# Patient Record
Sex: Female | Born: 1935 | Race: White | Hispanic: No | Marital: Married | Smoking: Never smoker
Health system: Southern US, Community
[De-identification: ages and names within clinical notes are randomized; demographics above are authoritative.]

## PROBLEM LIST (undated history)

## (undated) DIAGNOSIS — H9110 Presbycusis, unspecified ear: Secondary | ICD-10-CM

## (undated) DIAGNOSIS — I10 Essential (primary) hypertension: Secondary | ICD-10-CM

## (undated) DIAGNOSIS — F0390 Unspecified dementia without behavioral disturbance: Secondary | ICD-10-CM

## (undated) DIAGNOSIS — R112 Nausea with vomiting, unspecified: Secondary | ICD-10-CM

## (undated) DIAGNOSIS — I739 Peripheral vascular disease, unspecified: Secondary | ICD-10-CM

## (undated) DIAGNOSIS — M810 Age-related osteoporosis without current pathological fracture: Secondary | ICD-10-CM

## (undated) DIAGNOSIS — K589 Irritable bowel syndrome without diarrhea: Secondary | ICD-10-CM

## (undated) DIAGNOSIS — I509 Heart failure, unspecified: Secondary | ICD-10-CM

## (undated) DIAGNOSIS — Z9889 Other specified postprocedural states: Secondary | ICD-10-CM

## (undated) DIAGNOSIS — H409 Unspecified glaucoma: Secondary | ICD-10-CM

## (undated) DIAGNOSIS — K5730 Diverticulosis of large intestine without perforation or abscess without bleeding: Secondary | ICD-10-CM

## (undated) DIAGNOSIS — I998 Other disorder of circulatory system: Secondary | ICD-10-CM

## (undated) DIAGNOSIS — N183 Chronic kidney disease, stage 3 (moderate): Secondary | ICD-10-CM

## (undated) HISTORY — DX: Irritable bowel syndrome, unspecified: K58.9

## (undated) HISTORY — DX: Presbycusis, unspecified ear: H91.10

## (undated) HISTORY — DX: Diverticulosis of large intestine without perforation or abscess without bleeding: K57.30

## (undated) HISTORY — DX: Heart failure, unspecified: I50.9

## (undated) HISTORY — DX: Age-related osteoporosis without current pathological fracture: M81.0

## (undated) HISTORY — PX: ABDOMINAL HYSTERECTOMY: SHX81

---

## 1966-10-06 HISTORY — PX: BREAST SURGERY: SHX581

## 1998-12-04 ENCOUNTER — Ambulatory Visit (HOSPITAL_COMMUNITY): Admission: RE | Admit: 1998-12-04 | Discharge: 1998-12-04 | Payer: Self-pay | Admitting: Gastroenterology

## 2002-09-06 ENCOUNTER — Emergency Department (HOSPITAL_COMMUNITY): Admission: EM | Admit: 2002-09-06 | Discharge: 2002-09-06 | Payer: Self-pay | Admitting: Emergency Medicine

## 2002-09-06 ENCOUNTER — Encounter: Payer: Self-pay | Admitting: Emergency Medicine

## 2007-12-05 ENCOUNTER — Ambulatory Visit: Payer: Self-pay | Admitting: Surgery

## 2007-12-05 ENCOUNTER — Inpatient Hospital Stay (HOSPITAL_COMMUNITY): Admission: EM | Admit: 2007-12-05 | Discharge: 2007-12-09 | Payer: Self-pay | Admitting: Emergency Medicine

## 2007-12-05 ENCOUNTER — Encounter (INDEPENDENT_AMBULATORY_CARE_PROVIDER_SITE_OTHER): Payer: Self-pay | Admitting: Emergency Medicine

## 2010-07-11 ENCOUNTER — Encounter: Admission: RE | Admit: 2010-07-11 | Discharge: 2010-07-11 | Payer: Self-pay | Admitting: Family Medicine

## 2010-09-13 ENCOUNTER — Emergency Department (HOSPITAL_BASED_OUTPATIENT_CLINIC_OR_DEPARTMENT_OTHER)
Admission: EM | Admit: 2010-09-13 | Discharge: 2010-09-14 | Payer: Self-pay | Source: Home / Self Care | Admitting: Emergency Medicine

## 2010-09-17 ENCOUNTER — Encounter
Admission: RE | Admit: 2010-09-17 | Discharge: 2010-09-17 | Payer: Self-pay | Source: Home / Self Care | Attending: Orthopedic Surgery | Admitting: Orthopedic Surgery

## 2010-10-16 ENCOUNTER — Encounter
Admission: RE | Admit: 2010-10-16 | Discharge: 2010-10-16 | Payer: Self-pay | Source: Home / Self Care | Attending: Orthopedic Surgery | Admitting: Orthopedic Surgery

## 2010-10-18 ENCOUNTER — Encounter
Admission: RE | Admit: 2010-10-18 | Discharge: 2010-10-18 | Payer: Self-pay | Source: Home / Self Care | Attending: Orthopedic Surgery | Admitting: Orthopedic Surgery

## 2010-10-27 ENCOUNTER — Encounter: Payer: Self-pay | Admitting: Family Medicine

## 2010-12-05 ENCOUNTER — Ambulatory Visit: Payer: Medicare Other | Attending: Orthopedic Surgery | Admitting: Physical Therapy

## 2010-12-05 DIAGNOSIS — M25619 Stiffness of unspecified shoulder, not elsewhere classified: Secondary | ICD-10-CM | POA: Insufficient documentation

## 2010-12-05 DIAGNOSIS — IMO0001 Reserved for inherently not codable concepts without codable children: Secondary | ICD-10-CM | POA: Insufficient documentation

## 2010-12-05 DIAGNOSIS — M25519 Pain in unspecified shoulder: Secondary | ICD-10-CM | POA: Insufficient documentation

## 2010-12-05 DIAGNOSIS — R293 Abnormal posture: Secondary | ICD-10-CM | POA: Insufficient documentation

## 2010-12-11 ENCOUNTER — Ambulatory Visit: Payer: Medicare Other | Admitting: Physical Therapy

## 2010-12-13 ENCOUNTER — Ambulatory Visit: Payer: Medicare Other | Admitting: Physical Therapy

## 2010-12-16 ENCOUNTER — Ambulatory Visit: Payer: Medicare Other | Admitting: Physical Therapy

## 2010-12-19 ENCOUNTER — Ambulatory Visit: Payer: Medicare Other | Admitting: Physical Therapy

## 2010-12-24 ENCOUNTER — Ambulatory Visit: Payer: Medicare Other | Admitting: Physical Therapy

## 2010-12-26 ENCOUNTER — Ambulatory Visit: Payer: Medicare Other | Admitting: Physical Therapy

## 2010-12-31 ENCOUNTER — Ambulatory Visit: Payer: Medicare Other | Admitting: Physical Therapy

## 2011-01-01 ENCOUNTER — Ambulatory Visit: Payer: Medicare Other | Admitting: Physical Therapy

## 2011-01-02 ENCOUNTER — Ambulatory Visit: Payer: Medicare Other | Admitting: Physical Therapy

## 2011-01-06 ENCOUNTER — Ambulatory Visit: Payer: Medicare Other | Attending: Orthopedic Surgery | Admitting: Physical Therapy

## 2011-01-06 DIAGNOSIS — M25519 Pain in unspecified shoulder: Secondary | ICD-10-CM | POA: Insufficient documentation

## 2011-01-06 DIAGNOSIS — M25619 Stiffness of unspecified shoulder, not elsewhere classified: Secondary | ICD-10-CM | POA: Insufficient documentation

## 2011-01-06 DIAGNOSIS — R293 Abnormal posture: Secondary | ICD-10-CM | POA: Insufficient documentation

## 2011-01-06 DIAGNOSIS — IMO0001 Reserved for inherently not codable concepts without codable children: Secondary | ICD-10-CM | POA: Insufficient documentation

## 2011-01-08 ENCOUNTER — Ambulatory Visit: Payer: Medicare Other | Admitting: Physical Therapy

## 2011-01-13 ENCOUNTER — Ambulatory Visit: Payer: Medicare Other | Admitting: Physical Therapy

## 2011-01-15 ENCOUNTER — Ambulatory Visit: Payer: Medicare Other | Admitting: Physical Therapy

## 2011-02-18 NOTE — Op Note (Signed)
[name], [name]             ACCOUNT NO.:  [id number]   MEDICAL RECORD NO.:  [id number]          PATIENT TYPE:  INP   LOCATION:  1310                         FACILITY:  [facility]   PHYSICIAN:  Eulas Post, MD    DATE OF BIRTH:  Aug 26, 1923   DATE OF PROCEDURE:  12/06/2007  DATE OF DISCHARGE:                               OPERATIVE REPORT   PREOPERATIVE DIAGNOSIS:  Left knee pain.   POSTOPERATIVE DIAGNOSIS:  Left knee pain.   PROCEDURE:  Left knee aspiration and injection.   ASPIRATE:  10 mL of clear normal appearing joint fluid.   INJECTED:  40 mg of Kenalog in 10 mL of Marcaine.   INDICATIONS FOR PROCEDURE:  The patient is an 77 year old woman who has  acute left knee pain that is so severe it has required hospitalization.  She elected to undergo the above named procedure.  The risks, benefits,  and alternatives were discussed.  She is willing to proceed.   DESCRIPTION OF PROCEDURE:  The left knee was prepped using Betadine.  An  18 gauge needle was used to aspirate and inject.  The patient tolerated  the procedure well and there were no complications.      Eulas Post, MD  Electronically Signed     JPL/MEDQ  D:  12/06/2007  T:  12/06/2007  Job:  [id number]

## 2011-02-18 NOTE — Consult Note (Signed)
[name], [name]             ACCOUNT NO.:  [id number]   MEDICAL RECORD NO.:  [id number]          PATIENT TYPE:  INP   LOCATION:  1310                         FACILITY:  [facility]   PHYSICIAN:  Eulas Post, MD    DATE OF BIRTH:  05-Aug-1923   DATE OF CONSULTATION:  12/06/2007  DATE OF DISCHARGE:                                 CONSULTATION   REQUESTING PHYSICIAN:  Michaelyn Barter, M.D.   CHIEF COMPLAINT:  Left knee pain.   HISTORY:  [name] is an 77 year old woman who complains of  sharp 9/10 left sided medial knee pain for the past 2 weeks.  She says  resting it makes it better and putting weight on it or trying to move  the knee makes it worse.  She has been unable to walk.  She was unable  to be managed at home and was brought into the emergency room and  admitted for pain control.  She has been trying to take  antiinflammatories per her medical doctor, but she has not had any  improvement.   REVIEW OF SYSTEMS:  She reports the recent musculoskeletal changes as  above.  She also reports recent skin problems with multiple skin  cancer  on her face that she is being treated for.  She denies any recent  weight, visual changes, hearing changes, chest pain, shortness of  breath, bowel or bladder problems, neurologic problems, psychiatric  problems, easy bruising, or immune problems.   PAST MEDICAL HISTORY:  Significant for hypertension and shoulder  bursitis over 20 years ago.   FAMILY HISTORY:  She denies history of gout.  Her father has a history  of coronary artery disease.   SOCIAL HISTORY:  She denies significant alcohol use.   PHYSICAL EXAMINATION:  VITAL SIGNS:  She has a temperature of 98.7,  pulse 72, blood pressure 147/54.  GENERAL:  She is in no acute distress.  NECK:  Her trachea is midline and she has symmetric musculature with  full range of motion of her neck without any radiculopathy.  HEENT:  Demonstrates healing scars over her face from the  work done on  her skin cancers.  CARDIAC:  She has a regular rate and rhythm and she had trace edema  distally in her legs.  RESPIRATORY:  She has no increase in respiratory efforts.  ABDOMEN:  Soft, nontender, nondistended with no masses.  LYMPHATIC:  She has no lymphadenopathy in her neck or axilla.  PSYCHIATRIC:  Her mood and affect are normal and her judgment and  insight are intact.  NEUROLOGIC:  Her sensation is intact distally and she has 2 plus  patellar tendon reflexes in both knees.  MUSCULOSKELETAL:  She is unable to walk due to pain.  Her digits and  nails are normal.  Her right lower extremity has full range of motion at  the knee with 5/5 strength, no instability and no tenderness.  The left  knee has significant medial joint line tenderness.  It has a mild  effusion.  The knee feels stable for varus and valgus stress, although  she gets pain  with valgus stress.  She is able to do a straight leg  raise but has pain.  Her sensation and motor are intact at her foot.  She does not have any pain with hip motion.  She has a negative straight  leg raise.   She has x-rays that demonstrate evidence for early degenerative changes,  however, these x-rays are non-weightbearing and difficult to interpret.  She has an MRI which the radiologist report at least on the preliminary  indicated no evidence for meniscal tear.  However, I feel confident that  there actually is a meniscus tear as seen on the T2 weighted images as a  high signal intensity extending through the meniscus and exiting out the  inferior surface.   IMPRESSION:  Left knee pain, consistent with a meniscus tear.   PLAN:  I have recommended a knee injection.  We are going to try and  aspirate joint fluid and send it for cell count as well as culture and  sensitivity and crystals.  She does not have any erythema over the knee  and I do not suspect any type of infection, but gout is certainly a  possibility.  We are  going to plan to aspirate her knee and see how she  does.  I will also inject Kenalog and some Marcaine.  If she is able to  do well with this, then we will continue to treat her conservatively.  It she does poorly then she may need an arthroscopy.      Eulas Post, MD  Electronically Signed     JPL/MEDQ  D:  12/06/2007  T:  12/06/2007  Job:  [id number]

## 2011-02-18 NOTE — H&P (Signed)
[name], [name]             ACCOUNT NO.:  [id number]   MEDICAL RECORD NO.:  [id number]          PATIENT TYPE:  INP   LOCATION:  0105                         FACILITY:  [facility]   PHYSICIAN:  Michaelyn Barter, M.D. DATE OF BIRTH:  04/10/1923   DATE OF ADMISSION:  12/05/2007  DATE OF DISCHARGE:                              HISTORY & PHYSICAL   PRIMARY CARE PHYSICIAN:  Gloriajean Dell. Andrey Campanile, M.D.   CHIEF COMPLAINT:  Left knee pain.   HISTORY OF PRESENT ILLNESS:  Ms. Brunet is an 77 year old female with  past medical history of hypertension who indicates that she developed  left knee pain approximately 2 weeks ago. She states that the pain has  progressively become worse since its initial onset. It has progressed to  the point whereby she can no longer walk secondary to pain in her left  knee.  She indicates that she went to see her primary care doctor, Dr.  Arletha Grippe, on Wednesday of last week which is 5 days ago.  X-rays were  done, and the patient was told that there may have been a bone fragment  present; arthritis could have also been present and possibly bursitis.  Ibuprofen was prescribed; however, the patient's symptoms became worse.  She indicates that the day after seeing her primary care doctor, her  knee began to swell, and, as a result, she has experiencing even more  decreased range of motion. She now indicates that she is unable to walk  without the assistance of at least 1 or 2 other individuals.  There have  been no fevers or chills.  No trauma.  She has never had similar pain  before.   PAST MEDICAL HISTORY:  1. Bursitis in both shoulders  2. Hypertension.   PAST SURGICAL HISTORY:  1. Hysterectomy.  2. Benign tumor removed from both breasts.   ALLERGIES:  1. PENICILLIN.  2. SULFA,  3. DEMEROL.  4. CODEINE.  5. PHENOBARBITAL.  6. DARVON.   SOCIAL HISTORY:  Cigarettes:  Never.  Alcohol:  Occasional.   FAMILY HISTORY:  The patient does not provide.   REVIEW  OF SYSTEMS:  As per HPI, otherwise all other systems are  negative.   PHYSICAL EXAMINATION:  GENERAL:  The patient is awake.  She is  cooperative.  She is in no obvious in no obvious distress.  VITAL SIGNS:  Temperature is 98.1, blood pressure 145/60, heart rate 54,  respirations 18 O2 saturation 96% on room air.  HEENT:  Atraumatic, normocephalic.  Anicteric. Extraocular muscles are  intact.  Oral mucosa is pink.  No thrush or exudates.  NECK:  Supple.  No lymphadenopathy, no thyromegaly.  CARDIAC:  S1-S2 present.  Regular rate and rhythm.  RESPIRATORY:  No crackles or wheezes.  ABDOMEN:  Flat, soft, nontender, nondistended.  Positive bowel sounds x4  quadrants.  No masses palpated.  EXTREMITIES:  Left knee is slightly swollen.  There is no erythema  visible.  There is some slight discomfort along the medial aspect of the  patient's knee to deep palpation.  Range of motion is significantly  decreased secondary to pain whenever  the patient tries to move her leg  against gravity.  There is no warmth noted to palpation.  NEUROLOGIC:  The patient is alert and oriented to person.  She states  that the month is February; the year is 2009.  She knows that she is in  the hospital but does not know which hospital.  MUSCULOSKELETAL:  5/5 bilateral arm strength.  Right leg strength is  5/5. Left leg strength is significantly decreased secondary to pain.   X-rays of the patient's knee were negative.   The patient's sodium level is 141, potassium 4.3, chloride 105, CO2 27,  glucose 92, BUN 33, creatinine 1.53, bilirubin total 0.9, alkaline  phosphatase 67, SGOT 20, SGPT 14, total protein 7.2, albumin 3.4,  calcium 9.4. PTT 31, PT 13.6, INR 1.0. D-dimer 0.83.  White blood cell  count 12.3, hemoglobin 11.9, hematocrit 34.7, platelets 256.   ASSESSMENT/PLAN:  1. Acute left knee pain.  The etiology of this is questionable.  X-      rays completed do not reveal the source of pain. The  differential      includes sources of monoarticular joint disease such as gout,      osteoarthritis, and septic joint.  It appears that the patient has      failed out patient therapy.  Her symptoms appear to have become      worse.  Will order an MRI of the patient's knee.  Will consult      orthopedic surgery. Will provide p.r.n. pain medications.  2. Renal insufficiency.  The acuity of this is questionable.  This may      be prerenal.  We will gently hydrate the patient and monitor BUN      and creatinine.  3. Hypoalbuminemia.  The significance of this is questionable.  Will      continue to monitor.  4. Mildly increased D-dimer.  The significance of this is      questionable.  The patient currently does not have any chest pain      or shortness of breath.  Will monitor this.  5. Gastrointestinal prophylaxis. Will provide Protonix.  6. Deep vein thrombosis prophylaxis.  Will provide Lovenox.  7. Bradycardia.  The patient is otherwise asymptomatic.  Will monitor.      Michaelyn Barter, M.D.  Electronically Signed     OR/MEDQ  D:  12/05/2007  T:  12/05/2007  Job:  [id number]   cc:   Gloriajean Dell. Andrey Campanile, M.D.  Fax: [phone]

## 2011-02-18 NOTE — Discharge Summary (Signed)
[name], [name]             ACCOUNT NO.:  [id number]   MEDICAL RECORD NO.:  [id number]          PATIENT TYPE:  INP   LOCATION:  1310                         FACILITY:  [facility]   PHYSICIAN:  Mobolaji B. Bakare, M.D.DATE OF BIRTH:  [birth year]   DATE OF ADMISSION:  12/05/2007  DATE OF DISCHARGE:  12/09/2007                               DISCHARGE SUMMARY   PRIMARY CARE PHYSICIAN:  Dr. Benedetto Goad   FINAL DIAGNOSES:  1. Left knee pain and effusion secondary to meniscal tear with      bursitis, pes anseris, bursitis also noted on MRI.  2. Renal insufficiency, improved   SECONDARY DIAGNOSIS:  Hypertension.   PROCEDURES:  1. X-ray, left knee, shows no acute findings.  There is evidence of      degenerative changes.  2. MRI left knee shows no active bony findings and very little      degenerative change for patient's age, meniscal degenerative change      but no discrete meniscal tear, intact ligamentous structures.      There is edema and fluid around the pes anserine tendon suggesting      pes anserinus bursitis.  There is also mild associated MCA      bursitis, small to moderate-sized joint effusion, medial patellar      plica noted. Small partially ruptured Baker's cyst.  3. Joint aspiration by Dr. Dion Saucier (orthopedic surgeon).   BRIEF HISTORY:  Please refer to the admission H&P for full details.  In  brief, [name] is an 77 year old Caucasian female who started  experiencing left knee pain about two weeks prior to presentation.  He  gradually progressed such that she had limitation with ambulation.  She  saw a primary care physician.  She had an x-ray done as an outpatient.  There was no acute fracture.  Ibuprofen was prescribed, and symptoms  were not getting any better.  Hence, the patient came over to the  emergency room for pain control.   HOSPITAL COURSE:  The patient was seen in consultation by orthopedic  surgeon.  She had an MRI; result is as noted above.  Dr.  Dion Saucier saw the  patient and opined that these findings are consistent with acute  meniscal tear.  However, he aspirated the left joint to rule out acute  gout.  Fluid was sent for analysis, and results indicated no gout.  There were no crystals.  White cell count was 5000.  Fluid culture has  shown no growth so far.  The patient had no leukocytosis or elevated  white cell count.  Hence, she was not started on any antibiotics.  She  improved subsequent to the joint aspiration.  She was seen by physical  and occupational therapy.  The patient has progressed slowly.  It is  felt that she will need short-term skilled nursing facility placement  for rehabilitation.  This has been arranged.   Acute renal insufficiency was felt to be secondary to dehydration and  probably nonsteroidal anti-inflammatory.  BUN was 33 and creatinine 1.53  on admission.  At the time of discharge, BUN was 37 and  creatinine of  1.16.  It should be noted that the patient's BUN actually normalized  with IV fluids.  However, given the degree of pain, orthopedic surgeon  recommended scheduled ibuprofen, and  BUN has been increasing.  This  nonsteroidal will be discontinued at the time of discharge, and the  patient will continue with oxycodone p.r.n. in the nursing facility.  The patient's blood pressure medication including Diovan  hydrochlorothiazide were initially held due to the elevated BUN and  creatinine, but these are normalizing.  Blood pressure is increasing.  Hence, Diovan hydrochlorothiazide will be resumed, along with Cartia XT.  She will need BMET checked in 1 week.   The patient's hemoglobin was relatively normal at the time of admission,  11.9, and it trended downwards 10.2 at the time of discharge.  This was  felt to be related to hemodilution.  She should have a CBC checked in 2-  3 weeks, and if necessary, anemia workup can be initiated by Dr. Andrey Campanile.   Sinus bradycardia.  The patient was noted on  admission to have sinus  bradycardia with a heart rate of 54.  She has been on atenolol prior to  hospitalization.  Atenolol was held on admission.  Bradycardia has  improved.  The patient was asymptomatic.  Atenolol will be restarted at  a lower dose, and he can be gradually titrated for blood pressure as her  heart rate permits.   DISCHARGE MEDICATION:  1. Oxycodone 5 mg p.o. q.4-6h. p.r.n. x4 for moderate to severe pain.  2. Tylenol 650 mg p.o. q.6h. p.r.n. for mild pain.  3. Multivitamin.  4. Aspirin 81 mg daily.  5. Atenolol 25 mg daily.  6. Diovan hydrochlorothiazide 160/25 mg daily.  7. Cartia XT 240 mg daily.   DISCHARGE LABORATORY DATA:  Sodium 136, potassium 4.2, chloride 108,  bicarb 22, BUN 37, creatinine 1.16, glucose 151, calcium 8.3, white  cells 10, hemoglobin 10.2, hematocrit 29.5, platelets 251.   FOLLOW UP/RECOMMENDATION:  1. This follow up with Dr. Dion Saucier in 3 weeks.  Nursing facility to      call for appointment.  Telephone number [phone].  2. Check CBC in 2 weeks.  3. Check BMET in 1 week.   DISCHARGE CONDITION:  Stable.   VITALS AT TIME OF DISCHARGE:  Temperature 98.4, pulse 60, respiratory  20, blood pressure 151/67, O2 saturations 95% on room air.     Patient to follow up with Dr. Benedetto Goad in 1 week.      Mobolaji B. Corky Downs, M.D.  Electronically Signed    MBB/MEDQ  D:  12/09/2007  T:  12/09/2007  Job:  [id number]   cc:   Gloriajean Dell. Andrey Campanile, M.D.  Fax: [phone]   Dion Saucier, M.D.

## 2011-05-03 IMAGING — CR DG HUMERUS 2V *R*
2 series · 2 of 2 positions shown · non-contrast
Comparison: 09/13/2010

CLINICAL DATA: Fell.  Follow-up fracture.

RIGHT HUMERUS - 2+ VIEW

[view not recorded (1 of 2)]
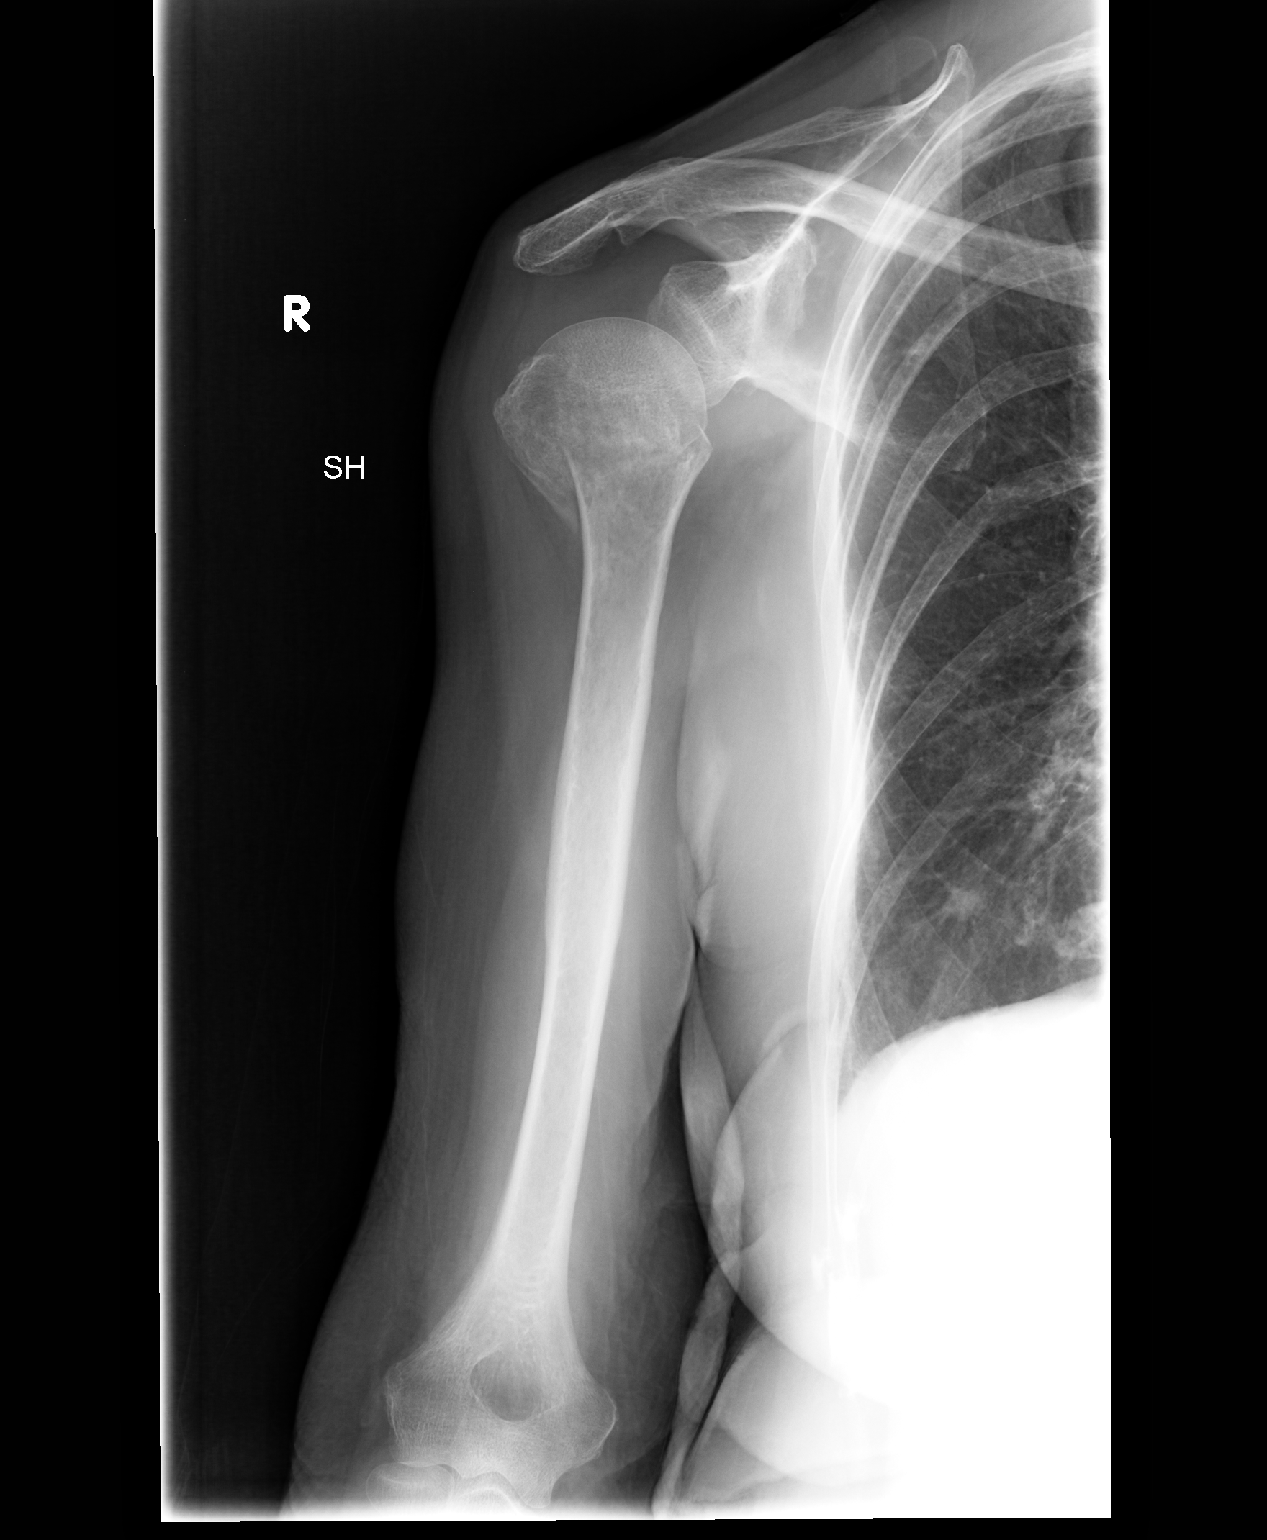

[view not recorded (2 of 2)]
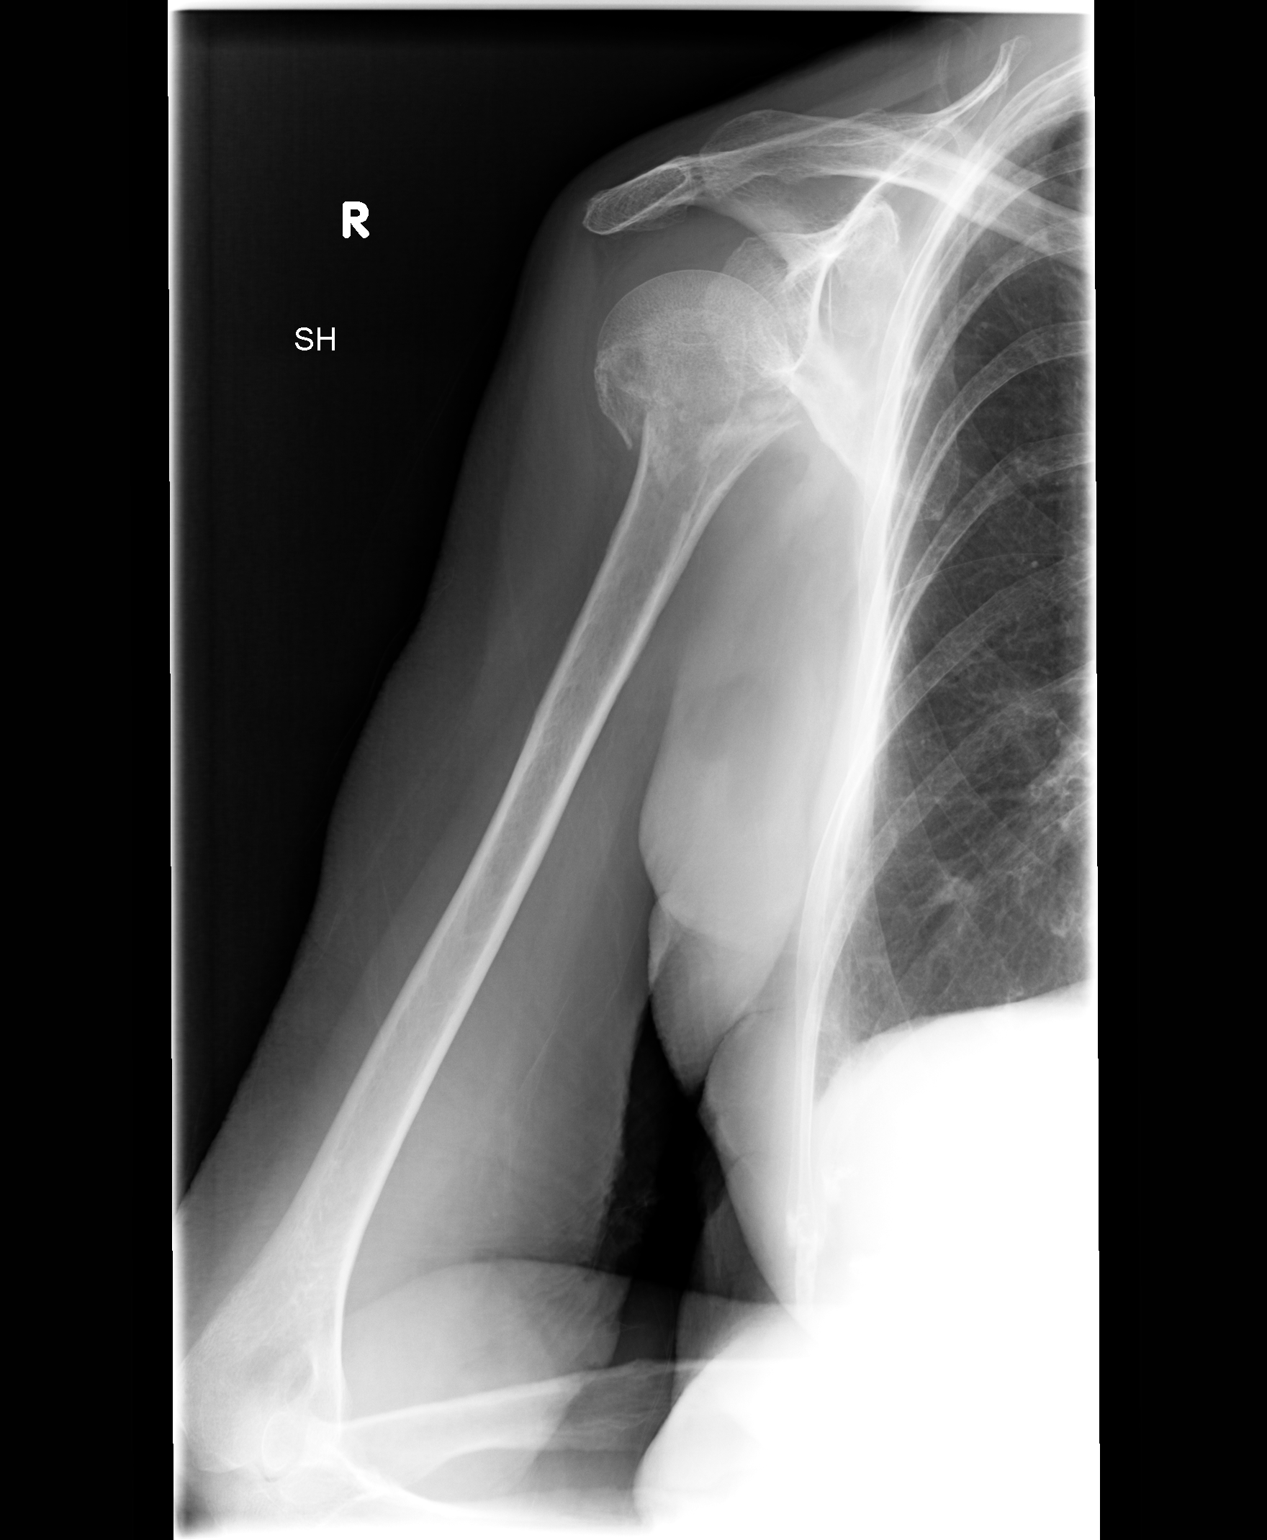

[2 of 2 positions shown; findings below may reference images not displayed]

FINDINGS: No change in position or alignment of the fracture
fragments of the proximal humerus.  Healing is ongoing with callus
formation and indistinct fracture margins.  No new finding
otherwise or any complicating feature.

I do note what appears to be a pulmonary nodule the right lower
lung.  This measures approximately 1 cm in size.  I do not see any
previous imaging of this area.  Two-view chest radiography would be
suggested.
IMPRESSION: No complications seen relating to the proximal humeral fracture.
Evidence of healing with callus formation and indistinctness of the
fracture margins.

Possible 1 cm mass/nodule at the right lung base.  Are there films
at any other location for comparison?  If not, chest radiography
would be suggested.

## 2011-05-05 IMAGING — CR DG CHEST 2V
2 series · 2 of 2 positions shown · non-contrast
Comparison: 10/16/2010 shoulder films.

CLINICAL DATA: Nodule found on shoulder films.  Nonsmoker.  No
chest complaints.

CHEST - 2 VIEW

[view not recorded (1 of 2)]
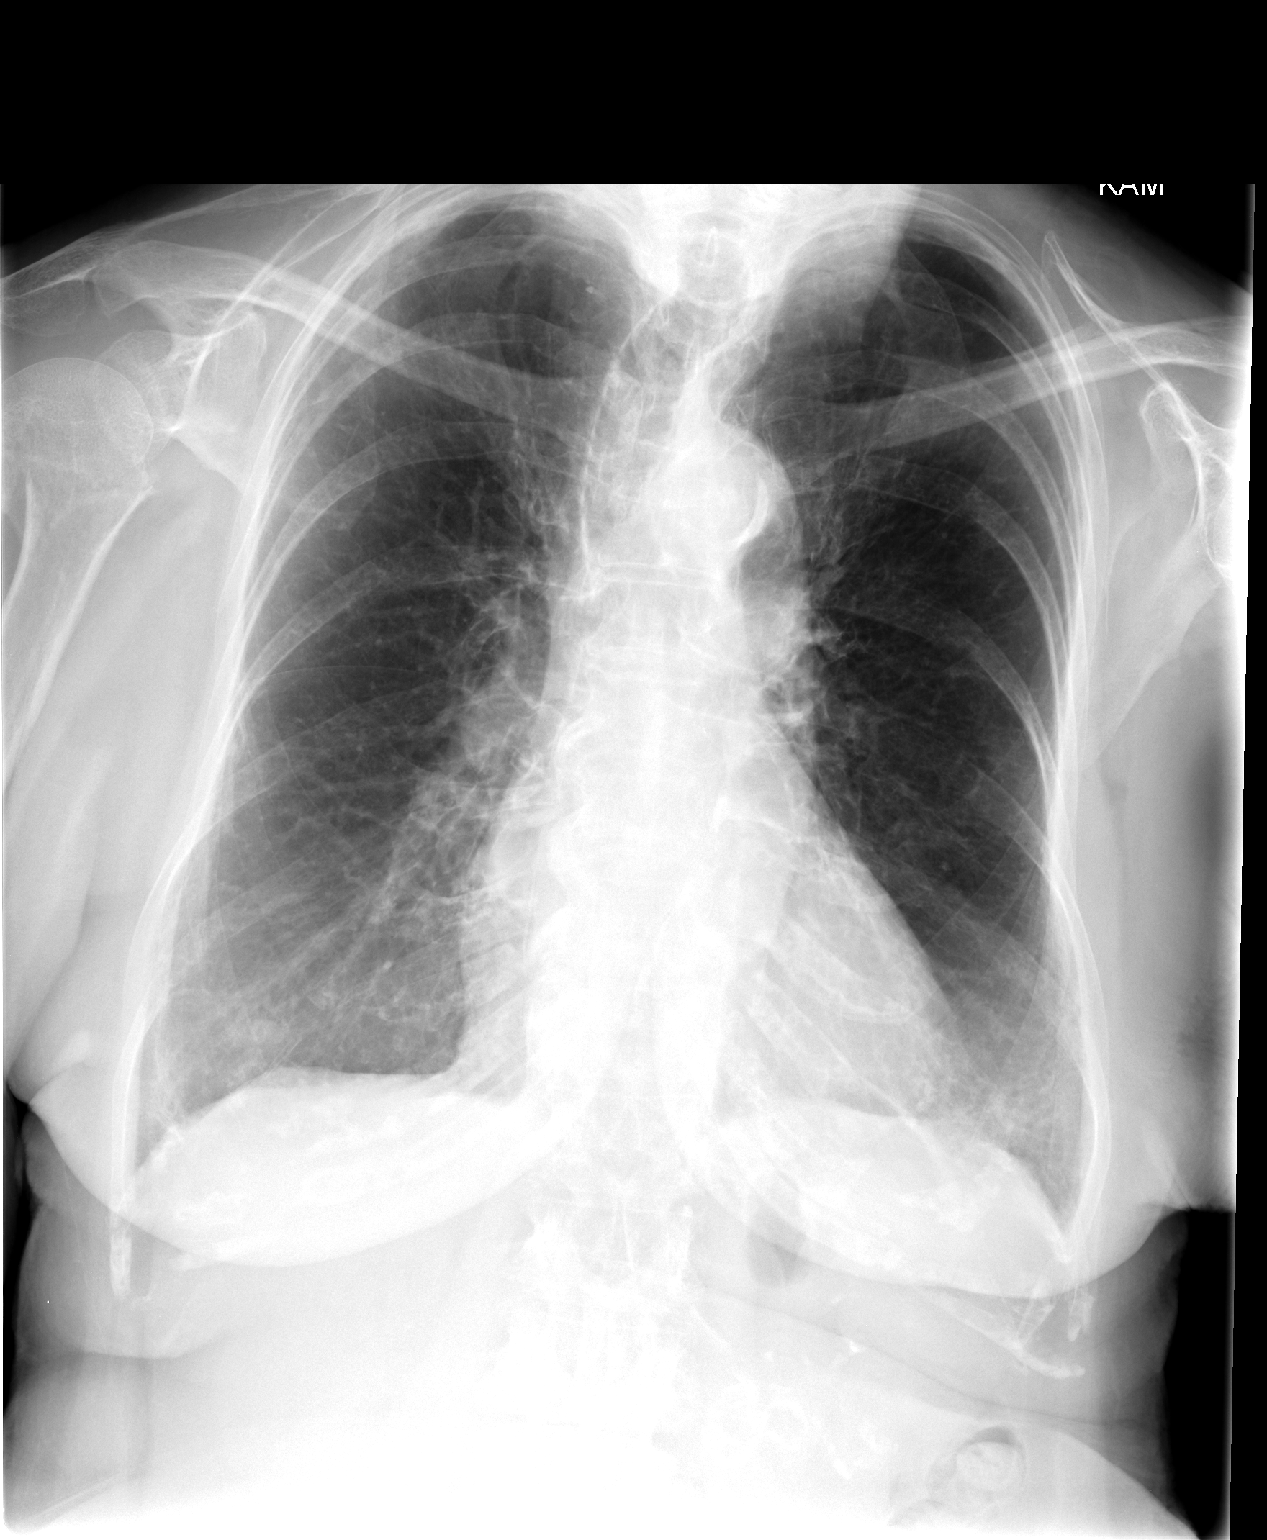

[view not recorded (2 of 2)]
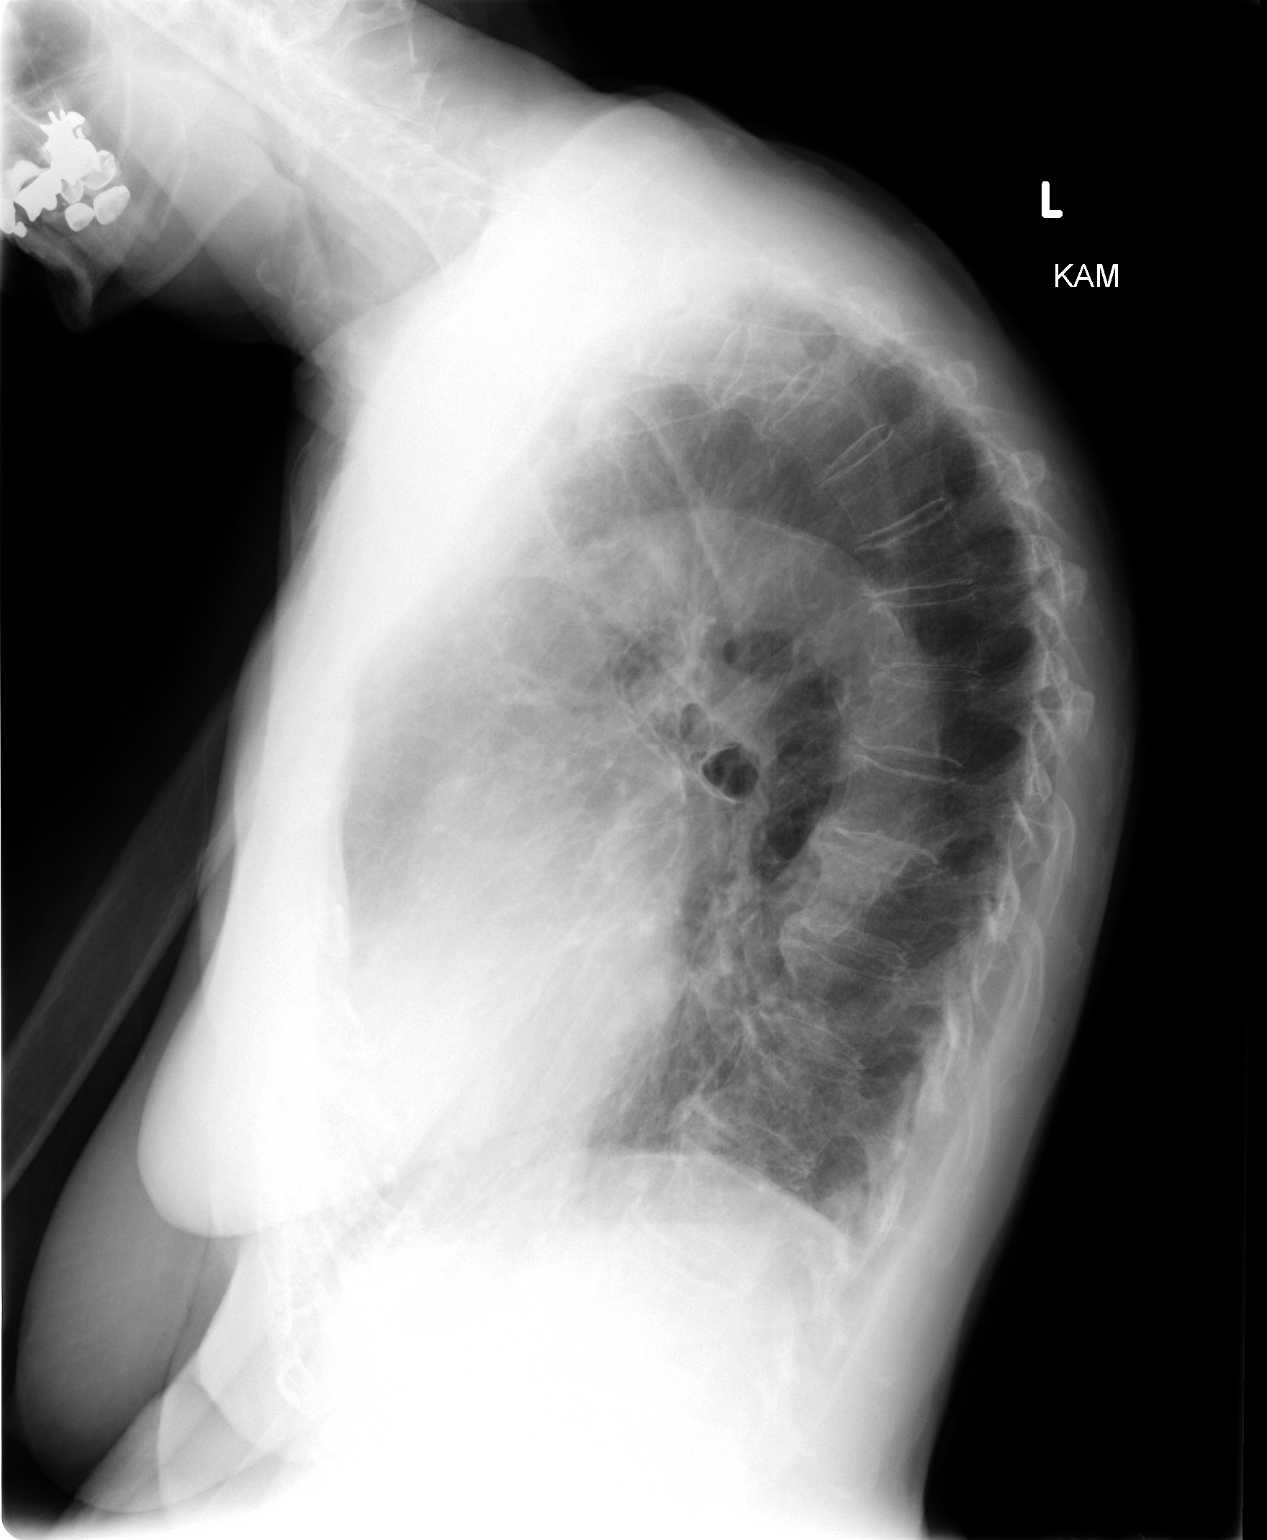

[2 of 2 positions shown; findings below may reference images not displayed]

FINDINGS: Unfortunately, the nodule at the right base remains.  It
is possible this represents a nipple shadow but will require nipple
marker view for further delineation.

Scarring lung bases suspected. No infiltrate, congestive heart
failure or pneumothorax.  Tortuous calcified aorta.  Heart size
within normal limits.  Right humeral neck fracture.

Thoracic kyphosis.
IMPRESSION: Unfortunately, the nodule at the right base remains.  It is
possible this represents a nipple shadow but will require nipple
marker view for further delineation.

## 2011-06-30 LAB — CBC
HCT: 34.7 — ABNORMAL LOW
Hemoglobin: 11.9 — ABNORMAL LOW
MCHC: 34.2
MCHC: 34.8
MCV: 92.1
MCV: 92.5
MCV: 93.2
Platelets: 231
Platelets: 251
RBC: 3.73 — ABNORMAL LOW
WBC: 10
WBC: 12.3 — ABNORMAL HIGH

## 2011-06-30 LAB — BASIC METABOLIC PANEL
BUN: 21
BUN: 25 — ABNORMAL HIGH
BUN: 37 — ABNORMAL HIGH
CO2: 26
Calcium: 8.3 — ABNORMAL LOW
Calcium: 9
Calcium: 9.1
Chloride: 106
Chloride: 108
Creatinine, Ser: 0.97
Creatinine, Ser: 1.16
Creatinine, Ser: 1.27 — ABNORMAL HIGH
GFR calc non Af Amer: 55 — ABNORMAL LOW
Glucose, Bld: 162 — ABNORMAL HIGH

## 2011-06-30 LAB — BODY FLUID CULTURE

## 2011-06-30 LAB — SYNOVIAL CELL COUNT + DIFF, W/ CRYSTALS
Crystals, Fluid: NONE SEEN
Lymphocytes-Synovial Fld: 4
Neutrophil, Synovial: 87 — ABNORMAL HIGH
WBC, Synovial: 5020 — ABNORMAL HIGH

## 2011-06-30 LAB — COMPREHENSIVE METABOLIC PANEL
AST: 20
CO2: 27
Calcium: 9.4
Chloride: 105
Creatinine, Ser: 1.53 — ABNORMAL HIGH
GFR calc Af Amer: 39 — ABNORMAL LOW
GFR calc non Af Amer: 32 — ABNORMAL LOW
Glucose, Bld: 92
Total Bilirubin: 0.9

## 2011-06-30 LAB — DIFFERENTIAL
Basophils Absolute: 0
Eosinophils Absolute: 0.7
Eosinophils Relative: 6 — ABNORMAL HIGH
Lymphocytes Relative: 16
Lymphs Abs: 2
Neutrophils Relative %: 70

## 2011-06-30 LAB — D-DIMER, QUANTITATIVE: D-Dimer, Quant: 0.83 — ABNORMAL HIGH

## 2011-06-30 LAB — APTT: aPTT: 31

## 2011-06-30 LAB — PROTIME-INR: Prothrombin Time: 13.6

## 2013-03-30 ENCOUNTER — Encounter: Payer: Self-pay | Admitting: Cardiology

## 2013-03-30 ENCOUNTER — Ambulatory Visit (INDEPENDENT_AMBULATORY_CARE_PROVIDER_SITE_OTHER): Payer: Medicare Other | Admitting: Cardiology

## 2013-03-30 DIAGNOSIS — Z01818 Encounter for other preprocedural examination: Secondary | ICD-10-CM

## 2013-03-30 DIAGNOSIS — I999 Unspecified disorder of circulatory system: Secondary | ICD-10-CM

## 2013-03-30 DIAGNOSIS — L97509 Non-pressure chronic ulcer of other part of unspecified foot with unspecified severity: Secondary | ICD-10-CM

## 2013-03-30 DIAGNOSIS — Z01810 Encounter for preprocedural cardiovascular examination: Secondary | ICD-10-CM

## 2013-03-30 DIAGNOSIS — I998 Other disorder of circulatory system: Secondary | ICD-10-CM

## 2013-03-30 DIAGNOSIS — I10 Essential (primary) hypertension: Secondary | ICD-10-CM

## 2013-03-30 NOTE — Patient Instructions (Addendum)
Your physician has requested that you have a lower  extremity arterial duplex. This test is an ultrasound of the arteries in the legs or arms. It looks at arterial blood flow in the legs . Allow one hour for Lower  Arterial scans. There are no restrictions or special instructions  You have been referred to Wound [facility] at [facility].  Your physician recommends that you schedule a follow-up appointment in  1 to 2 weeks

## 2013-04-01 ENCOUNTER — Emergency Department (HOSPITAL_COMMUNITY)
Admission: EM | Admit: 2013-04-01 | Discharge: 2013-04-01 | Disposition: A | Discharge status: Home / Self Care | Payer: Medicare Other | Attending: Emergency Medicine | Admitting: Emergency Medicine

## 2013-04-01 ENCOUNTER — Encounter (HOSPITAL_COMMUNITY): Payer: Self-pay

## 2013-04-01 DIAGNOSIS — Z79899 Other long term (current) drug therapy: Secondary | ICD-10-CM | POA: Insufficient documentation

## 2013-04-01 DIAGNOSIS — I739 Peripheral vascular disease, unspecified: Secondary | ICD-10-CM | POA: Insufficient documentation

## 2013-04-01 DIAGNOSIS — Z8669 Personal history of other diseases of the nervous system and sense organs: Secondary | ICD-10-CM | POA: Insufficient documentation

## 2013-04-01 DIAGNOSIS — I771 Stricture of artery: Secondary | ICD-10-CM

## 2013-04-01 DIAGNOSIS — F068 Other specified mental disorders due to known physiological condition: Secondary | ICD-10-CM | POA: Insufficient documentation

## 2013-04-01 DIAGNOSIS — I10 Essential (primary) hypertension: Secondary | ICD-10-CM | POA: Insufficient documentation

## 2013-04-01 HISTORY — DX: Unspecified dementia, unspecified severity, without behavioral disturbance, psychotic disturbance, mood disturbance, and anxiety: F03.90

## 2013-04-01 HISTORY — DX: Unspecified glaucoma: H40.9

## 2013-04-01 HISTORY — DX: Essential (primary) hypertension: I10

## 2013-04-01 LAB — CBC WITH DIFFERENTIAL/PLATELET
Basophils Absolute: 0.1 10*3/uL (ref 0.0–0.1)
Basophils Relative: 1 % (ref 0–1)
HCT: 35.7 % — ABNORMAL LOW (ref 36.0–46.0)
MCHC: 33.6 g/dL (ref 30.0–36.0)
Monocytes Absolute: 0.9 10*3/uL (ref 0.1–1.0)
Neutro Abs: 5.9 10*3/uL (ref 1.7–7.7)
Neutrophils Relative %: 60 % (ref 43–77)
RDW: 12.8 % (ref 11.5–15.5)

## 2013-04-01 LAB — POCT I-STAT, CHEM 8
BUN: 31 mg/dL — ABNORMAL HIGH (ref 6–23)
Calcium, Ion: 1.2 mmol/L (ref 1.13–1.30)
Creatinine, Ser: 1.5 mg/dL — ABNORMAL HIGH (ref 0.50–1.10)
TCO2: 26 mmol/L (ref 0–100)

## 2013-04-01 LAB — APTT: aPTT: 29 seconds (ref 24–37)

## 2013-04-01 MED ORDER — ONDANSETRON 4 MG PO TBDP
8.0000 mg | ORAL_TABLET | Freq: Once | ORAL | Status: AC
  Administered 2013-04-01: 8 mg via ORAL
  Filled 2013-04-01: qty 2

## 2013-04-01 MED ORDER — HYDROCODONE-ACETAMINOPHEN 5-325 MG PO TABS
1.0000 | ORAL_TABLET | Freq: Once | ORAL | Status: AC
  Administered 2013-04-01: 1 via ORAL
  Filled 2013-04-01: qty 1

## 2013-04-01 MED ORDER — HYDROCODONE-ACETAMINOPHEN 5-325 MG PO TABS: 1.0000 | ORAL_TABLET | Freq: Four times a day (QID) | ORAL | Status: DC | PRN

## 2013-04-01 NOTE — Progress Notes (Addendum)
Bilateral lower extremity arterial duplex completed.  There appears to be stenosis in the left distal femoral artery with collateral flow noted.  No evidence of significant stenosis in the left lower extremity.

## 2013-04-01 NOTE — Progress Notes (Addendum)
VASCULAR LAB PRELIMINARY  ARTERIAL  ABI completed:    RIGHT    LEFT    PRESSURE WAVEFORM  PRESSURE WAVEFORM  BRACHIAL 154 Triphasic BRACHIAL 164 Triphasic  DP 123 Monophasic DP 97 Monophasic  AT   AT    PT  Unable to insonate. PT 111 Monophasic  PER   PER    GREAT TOE  NA GREAT TOE  NA    RIGHT LEFT  ABI 0.75 0.68   The right ABI is suggestive of mild to moderate arterial insufficiency, the left ABI is suggestive of moderate arterial insufficiency.  Lower extremity arterial duplex pending.  04/01/2013 5:44 PM Gertie Fey, RVT, RDCS, RDMS

## 2013-04-01 NOTE — Progress Notes (Addendum)
ED CM noted Consulted to POD A-12,  presents today one month of gradually worsening pain in her left fifth toe. It is discolored.  In to speak patient and daughter with patient's permission. Patient's daughters states, that mom has an appt. vascular mid July for tx plan of toe. Pt not able to bear much weight on foot Pt has walker at home..  Family is questioning homecare as a n option.  Explained the home health option. Pt is insured by [facility].. Pt and Family verbalized understanding. Provided patient and family with a list of Private  Duty Home Health Agencies in the [place] area.

## 2013-04-01 NOTE — ED Notes (Signed)
Pt seen by PCP who has pt set to follow up with cardiologist and wound care. Unable to get appts until mid July, but due to increase in pain and great toe increasing in blackness pt was concerned. Pt reports taking tramadol this AM with minimal relief.

## 2013-04-01 NOTE — ED Notes (Signed)
Pt's family comfortable with d/c and f/u instructions.

## 2013-04-01 NOTE — ED Notes (Signed)
Lower extremity duplex being completed at bedside.

## 2013-04-01 NOTE — ED Provider Notes (Signed)
History    CSN: [id number] Arrival date & time 04/01/13  1338  First MD Initiated Contact with Patient 04/01/13 1504     No chief complaint on file.  (Consider location/radiation/quality/duration/timing/severity/associated sxs/prior Treatment) HPI Comments: Patient is an 79 year old female who presents today one month of gradually worsening pain in her left fifth toe. It is discolored. The pain does not radiate. Walking makes the pain worse. Resting makes the pain better. She has been evaluated for this by her primary care physician who referred her to vascular surgery. She is scheduled for an arterial duplex study in her affected leg in mid July. She also was referred to wound care, but they cannot get her in until mid July as well. She states she is otherwise feeling well. She denies fevers, chills, nausea, vomiting, shortness of breath. She denies history of diabetes, smoking, prior cardiac history.  The history is provided by the patient and a relative. No language interpreter was used.   Past Medical History  Diagnosis Date  . Hypertension   . Dementia   . Glaucoma    Past Surgical History  Procedure Laterality Date  . Breast surgery    . Abdominal hysterectomy     History reviewed. No pertinent family history. History  Substance Use Topics  . Smoking status: Never Smoker   . Smokeless tobacco: Not on file  . Alcohol Use: No   OB History   Grav Para Term Preterm Abortions TAB SAB Ect Mult Living                 Review of Systems  Constitutional: Negative for fever and chills.  Respiratory: Negative for chest tightness and shortness of breath.   Cardiovascular: Negative for chest pain.  Gastrointestinal: Negative for nausea, vomiting and abdominal pain.  Musculoskeletal: Positive for arthralgias.  Skin: Positive for wound.  All other systems reviewed and are negative.    Allergies  Codeine; Demerol; Benzocaine; Darvon; Penicillins; Phenobarbital; and Sulfa  antibiotics  Home Medications   Current Outpatient Rx  Name  Route  Sig  Dispense  Refill  . Ascorbic Acid (VITAMIN C PO)   Oral   Take 1 tablet by mouth daily.         . Cyanocobalamin (VITAMIN B 12 PO)   Oral   Take 1,000 mg by mouth daily.         Marland Kitchen diltiazem (CARDIZEM CD) 240 MG 24 hr capsule   Oral   Take 240 mg by mouth daily.         Marland Kitchen donepezil (ARICEPT) 5 MG tablet   Oral   Take 2.5 mg by mouth every morning.          Marland Kitchen losartan-hydrochlorothiazide (HYZAAR) 100-12.5 MG per tablet   Oral   Take 1 tablet by mouth daily.         Bertram Gala Glycol-Propyl Glycol (SYSTANE OP)   Both Eyes   Place 1 drop into both eyes as needed (dry eyes).         . traMADol (ULTRAM) 50 MG tablet   Oral   Take 50 mg by mouth every 6 (six) hours as needed for pain.          BP 145/90  Pulse 75  Temp(Src) 98 F (36.7 C) (Oral)  Resp 20  SpO2 95% Physical Exam  Nursing note and vitals reviewed. Constitutional: She is oriented to person, place, and time. She appears well-developed and well-nourished. No distress.  HENT:  Head:  Normocephalic and atraumatic.  Right Ear: External ear normal.  Left Ear: External ear normal.  Nose: Nose normal.  Mouth/Throat: Oropharynx is clear and moist.  Eyes: Conjunctivae are normal.  Neck: Normal range of motion.  Cardiovascular: Normal rate, regular rhythm, normal heart sounds and intact distal pulses.   All toes on left foot cool, mottled - 5th toe is the worst   Pulmonary/Chest: Effort normal and breath sounds normal. No stridor. No respiratory distress. She has no wheezes. She has no rales.  Abdominal: Soft. She exhibits no distension.  Musculoskeletal: Normal range of motion.       Feet:  Deep purple discoloration of and tender left 5th toe   Neurological: She is alert and oriented to person, place, and time. She has normal strength.  Skin: Skin is warm and dry. She is not diaphoretic. No erythema.  Psychiatric: She  has a normal mood and affect. Her behavior is normal.    ED Course  Procedures (including critical care time)  Labs Reviewed  CBC WITH DIFFERENTIAL - Abnormal; Notable for the following:    RBC 3.83 (*)    HCT 35.7 (*)    All other components within normal limits  POCT I-STAT, CHEM 8 - Abnormal; Notable for the following:    BUN 31 (*)    Creatinine, Ser 1.50 (*)    All other components within normal limits  PROTIME-INR  APTT    No results found.  1. Arterial insufficiency     MDM  Discussed case with [facility] who recommend I get the arterial duplex today. Discussed abnormal ABI. Arterial duplex pending. They will see her in the office Monday. She was given additional pain medication. Consulted case management because daughters were concerned that patient would not have transportation to appointment as they work during the week. Strict return instructions given. Vital signs stable for discharge. Discussed case with Dr. Anitra Lauth who agrees with plan. Patient / Family / Caregiver informed of clinical course, understand medical decision-making process, and agree with plan.   Mora Bellman, PA-C 04/02/13 [phone]

## 2013-04-01 NOTE — ED Notes (Signed)
Pt presents with 2-3 month h/o discolored toes to L foot..  Pt was seen by PCP on Monday, has duplex scheduled in 2 weeks.  Pt is able to bearing weight, but with increased pain.

## 2013-04-02 NOTE — ED Provider Notes (Signed)
Medical screening examination/treatment/procedure(s) were conducted as a shared visit with non-physician practitioner(s) and myself.  I personally evaluated the patient during the encounter Pt with findings on exam concerning for arterial insufficiency with necrosis of the left fifth toe and mild changes of the other 4 toes with coolness.  Pt had good DP pulse.  No hx of DM, smoking or a.fib.  ABI  With moderate abnormality on the left.  Discussed with [facility] and will f/u with pt.  Gwyneth Sprout, MD 04/02/13 2308

## 2013-04-04 ENCOUNTER — Other Ambulatory Visit: Payer: Self-pay | Admitting: Cardiology

## 2013-04-04 ENCOUNTER — Ambulatory Visit (INDEPENDENT_AMBULATORY_CARE_PROVIDER_SITE_OTHER): Payer: Medicare Other | Admitting: Cardiology

## 2013-04-04 ENCOUNTER — Ambulatory Visit (HOSPITAL_COMMUNITY)
Admission: RE | Admit: 2013-04-04 | Discharge: 2013-04-04 | Disposition: A | Discharge status: Home / Self Care | Payer: Medicare Other | Source: Ambulatory Visit | Attending: Internal Medicine | Admitting: Internal Medicine

## 2013-04-04 ENCOUNTER — Encounter: Payer: Self-pay | Admitting: Cardiology

## 2013-04-04 ENCOUNTER — Other Ambulatory Visit (HOSPITAL_COMMUNITY): Payer: Self-pay | Admitting: Cardiology

## 2013-04-04 ENCOUNTER — Other Ambulatory Visit: Payer: Self-pay | Admitting: Physician Assistant

## 2013-04-04 VITALS — BP 110/70 | HR 72 | Ht 64.0 in | Wt 141.0 lb

## 2013-04-04 DIAGNOSIS — I999 Unspecified disorder of circulatory system: Secondary | ICD-10-CM

## 2013-04-04 DIAGNOSIS — I998 Other disorder of circulatory system: Secondary | ICD-10-CM | POA: Insufficient documentation

## 2013-04-04 DIAGNOSIS — L97519 Non-pressure chronic ulcer of other part of right foot with unspecified severity: Secondary | ICD-10-CM

## 2013-04-04 DIAGNOSIS — F03918 Unspecified dementia, unspecified severity, with other behavioral disturbance: Secondary | ICD-10-CM | POA: Insufficient documentation

## 2013-04-04 DIAGNOSIS — L97509 Non-pressure chronic ulcer of other part of unspecified foot with unspecified severity: Secondary | ICD-10-CM

## 2013-04-04 DIAGNOSIS — F0390 Unspecified dementia without behavioral disturbance: Secondary | ICD-10-CM

## 2013-04-04 DIAGNOSIS — I110 Hypertensive heart disease with heart failure: Secondary | ICD-10-CM | POA: Insufficient documentation

## 2013-04-04 DIAGNOSIS — I10 Essential (primary) hypertension: Secondary | ICD-10-CM

## 2013-04-04 DIAGNOSIS — F0391 Unspecified dementia with behavioral disturbance: Secondary | ICD-10-CM | POA: Insufficient documentation

## 2013-04-04 NOTE — Assessment & Plan Note (Signed)
Seen by Dr Herbie Baltimore last week, work up scheduled including dopplers and a wound care consult

## 2013-04-04 NOTE — Assessment & Plan Note (Signed)
treated

## 2013-04-04 NOTE — Assessment & Plan Note (Signed)
Poor historian 

## 2013-04-04 NOTE — Progress Notes (Signed)
04/04/2013 [name]   30-Jun-1923  [id number]  Primary Physicia Pamelia Hoit, MD Primary Cardiologist: Dr Herbie Baltimore  HPI:  79 y/o seen by Dr Herbie Baltimore last week for an ischemic Lt 5th toe. His note is currently pending but apparently the pt was scheduled to have a wound care consult and arterial dopplers. This weekend the pt's family brought her to the ER because of pain. We were contacted and she is here now for further evaluation. She was put on Ultram in the ER and this seems to have helped.    Current Outpatient Prescriptions  Medication Sig Dispense Refill  . Ascorbic Acid (VITAMIN C PO) Take 1 tablet by mouth daily.      . Cyanocobalamin (VITAMIN B 12 PO) Take 1,000 mg by mouth daily.      Marland Kitchen diltiazem (CARDIZEM CD) 240 MG 24 hr capsule Take 240 mg by mouth daily.      Marland Kitchen donepezil (ARICEPT) 5 MG tablet Take 2.5 mg by mouth every morning.       Marland Kitchen HYDROcodone-acetaminophen (NORCO/VICODIN) 5-325 MG per tablet Take 1 tablet by mouth every 6 (six) hours as needed for pain.  6 tablet  0  . losartan-hydrochlorothiazide (HYZAAR) 100-12.5 MG per tablet Take 1 tablet by mouth daily.      Bertram Gala Glycol-Propyl Glycol (SYSTANE OP) Place 1 drop into both eyes as needed (dry eyes).      . traMADol (ULTRAM) 50 MG tablet Take 50 mg by mouth every 6 (six) hours as needed for pain.       No current facility-administered medications for this visit.    Allergies  Allergen Reactions  . Codeine Itching and Other (See Comments)    hyper  . Demerol (Meperidine) Nausea Only  . Benzocaine Rash  . Darvon (Propoxyphene) Rash  . Penicillins Itching and Rash  . Phenobarbital Itching and Rash  . Sulfa Antibiotics Itching and Rash    History   Social History  . Marital Status: Married    Spouse Name: N/A    Number of Children: N/A  . Years of Education: N/A   Occupational History  . Not on file.   Social History Main Topics  . Smoking status: Never Smoker   . Smokeless tobacco: Not on  file  . Alcohol Use: No  . Drug Use: No  . Sexually Active: Not on file   Other Topics Concern  . Not on file   Social History Narrative  . No narrative on file     Review of Systems: General: negative for chills, fever, night sweats or weight changes.  Cardiovascular: negative for chest pain, dyspnea on exertion, edema, orthopnea, palpitations, paroxysmal nocturnal dyspnea or shortness of breath Dermatological: negative for rash Respiratory: negative for cough or wheezing Urologic: negative for hematuria Abdominal: negative for nausea, vomiting, diarrhea, bright red blood per rectum, melena, or hematemesis Neurologic: negative for visual changes, syncope, or dizziness All other systems reviewed and are otherwise negative except as noted above.    Blood pressure 110/70, pulse 72, height 5\' 4"  (1.626 m), weight 141 lb (63.957 kg).  General appearance: alert, cooperative, appears stated age and no distress Lungs: decreased at bases Heart: regular rate and rhythm Extremities: Lt 5th toe black, dry, no signs of infection   ASSESSMENT AND PLAN:   Ischemic toe- Lt 5th toe Seen by Dr Herbie Baltimore last week, work up scheduled including dopplers and a wound care consult  Dementia Poor historian  HTN (hypertension) treated   PLAN  Continue symptomatic treatment, I will try and get arterial dopplers done today (scheduled to be done tomorrow) since the pt and her family are here. Follow up with Dr Herbie Baltimore and wound care as scheduled.   [facility] KPA-C 04/04/2013 2:33 PM

## 2013-04-04 NOTE — Progress Notes (Signed)
Lower Extremity Arterial Duplex Completed. °[name] ° °

## 2013-04-04 NOTE — Patient Instructions (Addendum)
Continue with pain medication as ordered. Dopplers today and then follow up with Dr Herbie Baltimore.

## 2013-04-05 ENCOUNTER — Encounter (HOSPITAL_COMMUNITY): Payer: Medicare Other

## 2013-04-06 ENCOUNTER — Encounter: Payer: Self-pay | Admitting: Cardiology

## 2013-04-06 ENCOUNTER — Telehealth: Payer: Self-pay | Admitting: *Deleted

## 2013-04-06 NOTE — Assessment & Plan Note (Signed)
No active cardiac issues. Would likely be safe to proceed directly with invasive peripheral vascular evaluation if indicated by Dopplers simply due to possibility of critical limb ischemia.

## 2013-04-06 NOTE — Progress Notes (Signed)
Patient ID: [name], female   DOB: Dec 20, 1922, 79 y.o.   MRN: [id number]  Clinic Note: HPI: [name] is a 79 y.o. female with a PMH below who presents today for evaluation of possible ischemic/infected left toe. She was referred by Dr. Benedetto Goad from [facility] at [place].She herself is not a very good historian. Most history comes from her daughters. She had a foot x-ray, but did not show any evidence of osteomyelitis. She is referred for possible ischemic toe with possible early gangrene  Interval History: She presents here with a main complaint of a dark blue, possibly infected left fourth toe. It is extremely painful and tender. GU there is no erythema coming out from it. It is not hot/warm. She denies any fevers or chills, no sweats. No claudication-type symptoms that I can derive from her. Her daughter does note that she complains sometimes of cramping in her legs, but she really does not walk around enough to note symptoms. She is relatively inactive, and currently now with her foot hurting her, she does not do much in way of any activity. But at baseline, she denies any chest discomfort or short of breath at rest or exertion. No PND, orthopnea or edema. No syncope or near-syncope, but she does have some mild orthostatic changes on occasion. No recent falls. No TIA or amaurosis fugax symptoms. She does have forgetfulness and confusion.   The remainder of cardiac review of systems is as follows: Cardiovascular ROS: no chest pain or dyspnea on exertion negative for - edema, irregular heartbeat, loss of consciousness, murmur, orthopnea, palpitations, paroxysmal nocturnal dyspnea, rapid heart rate or shortness of breath : Additional cardiac review of systems: She denies any melena, hematochezia or hematuria.   Past Medical History  Diagnosis Date  . Hypertension   . Dementia     Thought to be vascular  . Glaucoma   . Diverticulosis of colon     With  recurrent bouts of diverticulitis  . Irritable bowel syndrome (IBS)   . Hearing loss of aging   . Osteoporosis     Prior Cardiac Evaluation and Past Surgical History: Past Surgical History  Procedure Laterality Date  . Breast surgery    . Abdominal hysterectomy      Allergies  Allergen Reactions  . Codeine Itching and Other (See Comments)    hyper  . Demerol (Meperidine) Nausea Only  . Benzocaine Rash  . Darvon (Propoxyphene) Rash  . Penicillins Itching and Rash  . Phenobarbital Itching and Rash  . Sulfa Antibiotics Itching and Rash    Current Outpatient Prescriptions  Medication Sig Dispense Refill  . Cyanocobalamin (VITAMIN B 12 PO) Take 1,000 mg by mouth daily.      Marland Kitchen diltiazem (CARDIZEM CD) 240 MG 24 hr capsule Take 240 mg by mouth daily.      Marland Kitchen donepezil (ARICEPT) 5 MG tablet Take 2.5 mg by mouth every morning.       Marland Kitchen losartan-hydrochlorothiazide (HYZAAR) 100-12.5 MG per tablet Take 1 tablet by mouth daily.      . Ascorbic Acid (VITAMIN C PO) Take 1 tablet by mouth daily.      Marland Kitchen HYDROcodone-acetaminophen (NORCO/VICODIN) 5-325 MG per tablet Take 1 tablet by mouth every 6 (six) hours as needed for pain.  6 tablet  0  . Polyethyl Glycol-Propyl Glycol (SYSTANE OP) Place 1 drop into both eyes as needed (dry eyes).      . traMADol (ULTRAM) 50 MG tablet Take 50 mg by  mouth every 6 (six) hours as needed for pain.       No current facility-administered medications for this visit.    History   Social History  . Marital Status: Married    Spouse Name: N/A    Number of Children: 5  . Years of Education: N/A   Occupational History  . Retired Artist - [employer]   Social History Main Topics  . Smoking status: Never Smoker   . Smokeless tobacco: Not on file  . Alcohol Use: No  . Drug Use: No  . Sexually Active: Not on file   Other Topics Concern  . Not on file   Social History Narrative   Married mother of 5. Lives with her husband. Walks  occasionally with a cane. She previously worked as a Runner, broadcasting/film/video at [employer]. She is limited as far as her activity based on her arthritis.   History reviewed. No pertinent family history. she is not a good historian. Her daughters are not aware of any significant cardiovascular disease in her siblings or parents.  ROS: A comprehensive Review of Systems - Negative except Pertinent positives noted above including her forgetfulness, and chronic fatigue.  PHYSICAL EXAM BP 154/80  Pulse 65  Ht 5\' 4"  (1.626 m)  Wt 140 lb 8 oz (63.73 kg)  BMI 24.1 kg/m2 General appearance: alert, cooperative, appears stated age, no distress and She does seem to have some discomfort in her foot especially with examination. He tries to questions but is a very poor historian. She is a normal mood and affect, but has occasional blank stare as if she does not understand she is being asked Neck: no adenopathy, no carotid bruit, no JVD, supple, symmetrical, trachea midline and thyroid not enlarged, symmetric, no tenderness/mass/nodules Lungs: clear to auscultation bilaterally, normal percussion bilaterally and Nonlabored, good air movement. Heart: regular rate and rhythm, S1, S2 normal, no murmur, click, rub or gallop and normal apical impulse Abdomen: soft, non-tender; bowel sounds normal; no masses,  no organomegaly Extremities: No clubbing  or eeddemaa. Generally no cyanosis with the exception of the lateral aspect of the fourth digit on her left foot. It is dark with some mild discharge. Extremely painful it is right where the toe pushes against his neighboring toe. Very tender with any movement of the toe itself. There is no evidence of any spreading infection proximally. No erythema or warmth to touch. Pulses: She has very diminished popliteal and dorsalis pedis pulses on the left foot, with a slightly palpable/trace left posterior tibial pulse. She does a have soft left-sided femoral bruit. The  right-sided pulses are somewhat improved with 1+ pulses in the dorsalis pedis and posterior tibial pulses. Skin: Dark, eschar -- chronic ischemic toe Neurologic: Mental status: alertness: alert, orientation: person, place, city, affect: blunted Cranial nerves: normal HEENT: [place]/AT, EOMI, MMM, anicteric sclera  [id number] today: Yes Rate: 67 , Rhythm: Normal sinus rhythm, normal ECG   ASSESSMENT/PLAN: She has what appears to be a chronically ischemic left fourth toe. There is decreased pulses on this foot as no signs of active infection, but there is concern for possible gangrene. She needs wound care evaluation and assistance with dressings. I recommended that she place a gauze in between the 2 toes to avoid pressure. Also recommend loose dressing of the toe and to wear open toed yet protected shoes. I I have ordered bilateral lower extremity artery Dopplers with ABIs.  Her primary care provider has made recommendations  for pain management. As no active infection, I see no need for antibiotics. I'll see her back after her Dopplers.  However, if they're grossly abnormal, I would go ahead and refer her either to see Dr. Nanetta Batty here at [facility], or simply schedule her directly for more short angiography and possible percutaneous intervention. This would be with Dr. Allyson Sabal as primary and myself as Paramedic.  She has absolute no active signs symptoms or coronary disease. As there is urgency to evaluation of her peripheral vascular disease, no flank a cardiac evaluation is necessary at this time, as well likely not affect our plan.  Ulcer of toe, left - Plan: Lower Extremity Arterial Duplex Bilateral, AMB referral to wound care center  HTN (hypertension)  Preoperative vascular examination  Ischemic toe- Lt 4 forth toe   Marykay Lex, M.D., M.S. [facility] [address] [place], [place]   [address]  [phone] Pager # [phone] 04/06/2013 11:46 PM

## 2013-04-06 NOTE — Assessment & Plan Note (Signed)
Check lower extremity arterial Dopplers and ABIs.  Wound Care Center referral. Dressing recommendation provided.

## 2013-04-06 NOTE — Telephone Encounter (Signed)
Message copied by Tobin Chad on Wed Apr 06, 2013  5:44 PM ------      Message from: [facility], DAVID      Created: Wed Apr 06, 2013  4:35 PM       I reviewed this result.              Severe L SFA stenosis with occluded L ATA -- this correlates well with what appears to be an ischemic Left 4th toe.  The toe is painful & ecchymotic.              At this point, she has had 2 clinic visist & a clear cut abnormal study explaining her condition.  She needs her wound care,but will need Lower Extremity Angiography +/- Peripheral Intervention.            We should contact her (with daughters available) to see if she would simply be fine proceeding to a PV angiogram & possible intervention this coming Tuesday, July 8th -- can be a dual case with Myself & Dr. Allyson Sabal.      She has already had labs & has a note in the chart.  I can see her in the Short Stay Unit to go over the procedure.      If they would prefer to come in to discuss the procedure in clinic, she should just keep her Monday, July 7th appt.            Marykay Lex, MD       ------

## 2013-04-06 NOTE — Assessment & Plan Note (Addendum)
Somewhat elevated pressure today. With her vascular dementia, I am fine with permissive hypertension. This will also help her peripheral perfusion. She is on ARB and HCTZ accommodation. This is in addition to diltiazem.

## 2013-04-06 NOTE — Telephone Encounter (Signed)
Spoke to husband. Results given. Informed Mr Barb that Dr Herbie Baltimore recommends that Mrs. Coutant have a PV angiogram this upcoming tues. 7/8 at [facility].  Mr Walth is in agreement because it is difficulty for them to get around.Informed Mr Tretter that the scheduler will call tomorrow with the details concerning time. Per Dr Herbie Baltimore no labs are needed.

## 2013-04-07 ENCOUNTER — Encounter: Payer: Self-pay | Admitting: Cardiology

## 2013-04-07 ENCOUNTER — Encounter (HOSPITAL_COMMUNITY): Payer: Self-pay | Admitting: Pharmacy Technician

## 2013-04-11 ENCOUNTER — Other Ambulatory Visit: Payer: Self-pay | Admitting: *Deleted

## 2013-04-11 ENCOUNTER — Ambulatory Visit: Payer: Medicare Other | Admitting: Cardiology

## 2013-04-11 DIAGNOSIS — Z01810 Encounter for preprocedural cardiovascular examination: Secondary | ICD-10-CM

## 2013-04-12 ENCOUNTER — Encounter (HOSPITAL_COMMUNITY)
Admission: RE | Disposition: A | Discharge status: Home / Self Care | Payer: Self-pay | Source: Ambulatory Visit | Attending: Cardiovascular Disease

## 2013-04-12 ENCOUNTER — Ambulatory Visit (HOSPITAL_COMMUNITY)
Admission: RE | Admit: 2013-04-12 | Discharge: 2013-04-12 | Disposition: A | Discharge status: Home / Self Care | Payer: Medicare Other | Source: Ambulatory Visit | Attending: Cardiovascular Disease | Admitting: Cardiovascular Disease

## 2013-04-12 DIAGNOSIS — Z880 Allergy status to penicillin: Secondary | ICD-10-CM | POA: Insufficient documentation

## 2013-04-12 DIAGNOSIS — H409 Unspecified glaucoma: Secondary | ICD-10-CM | POA: Insufficient documentation

## 2013-04-12 DIAGNOSIS — M810 Age-related osteoporosis without current pathological fracture: Secondary | ICD-10-CM | POA: Insufficient documentation

## 2013-04-12 DIAGNOSIS — N185 Chronic kidney disease, stage 5: Secondary | ICD-10-CM | POA: Insufficient documentation

## 2013-04-12 DIAGNOSIS — I70209 Unspecified atherosclerosis of native arteries of extremities, unspecified extremity: Secondary | ICD-10-CM | POA: Insufficient documentation

## 2013-04-12 DIAGNOSIS — F0390 Unspecified dementia without behavioral disturbance: Secondary | ICD-10-CM | POA: Insufficient documentation

## 2013-04-12 DIAGNOSIS — K5730 Diverticulosis of large intestine without perforation or abscess without bleeding: Secondary | ICD-10-CM | POA: Insufficient documentation

## 2013-04-12 DIAGNOSIS — Z882 Allergy status to sulfonamides status: Secondary | ICD-10-CM | POA: Insufficient documentation

## 2013-04-12 DIAGNOSIS — Z01810 Encounter for preprocedural cardiovascular examination: Secondary | ICD-10-CM

## 2013-04-12 DIAGNOSIS — I70269 Atherosclerosis of native arteries of extremities with gangrene, unspecified extremity: Secondary | ICD-10-CM

## 2013-04-12 DIAGNOSIS — Z888 Allergy status to other drugs, medicaments and biological substances status: Secondary | ICD-10-CM | POA: Insufficient documentation

## 2013-04-12 DIAGNOSIS — Z79899 Other long term (current) drug therapy: Secondary | ICD-10-CM | POA: Insufficient documentation

## 2013-04-12 DIAGNOSIS — L97509 Non-pressure chronic ulcer of other part of unspecified foot with unspecified severity: Secondary | ICD-10-CM | POA: Insufficient documentation

## 2013-04-12 DIAGNOSIS — Z885 Allergy status to narcotic agent status: Secondary | ICD-10-CM | POA: Insufficient documentation

## 2013-04-12 DIAGNOSIS — I120 Hypertensive chronic kidney disease with stage 5 chronic kidney disease or end stage renal disease: Secondary | ICD-10-CM | POA: Insufficient documentation

## 2013-04-12 DIAGNOSIS — K589 Irritable bowel syndrome without diarrhea: Secondary | ICD-10-CM | POA: Insufficient documentation

## 2013-04-12 HISTORY — PX: LOWER EXTREMITY ANGIOGRAM: SHX5508

## 2013-04-12 SURGERY — ANGIOGRAM, LOWER EXTREMITY
Anesthesia: LOCAL | Laterality: Bilateral

## 2013-04-12 MED ORDER — SODIUM CHLORIDE 0.9 % IJ SOLN: 3.0000 mL | Freq: Two times a day (BID) | INTRAMUSCULAR | Status: DC

## 2013-04-12 MED ORDER — MIDAZOLAM HCL 2 MG/2ML IJ SOLN
INTRAMUSCULAR | Status: AC
  Filled 2013-04-12: qty 2

## 2013-04-12 MED ORDER — SODIUM CHLORIDE 0.9 % IV SOLN
INTRAVENOUS | Status: DC
  Administered 2013-04-12: 06:00:00 via INTRAVENOUS

## 2013-04-12 MED ORDER — FENTANYL CITRATE 0.05 MG/ML IJ SOLN
INTRAMUSCULAR | Status: AC
  Filled 2013-04-12: qty 2

## 2013-04-12 MED ORDER — HYDROCODONE-ACETAMINOPHEN 5-325 MG PO TABS
ORAL_TABLET | ORAL | Status: AC
  Filled 2013-04-12: qty 1

## 2013-04-12 MED ORDER — HYDROCODONE-ACETAMINOPHEN 5-325 MG PO TABS: 1.0000 | ORAL_TABLET | Freq: Four times a day (QID) | ORAL | Status: DC | PRN

## 2013-04-12 MED ORDER — HYDROCODONE-ACETAMINOPHEN 5-325 MG PO TABS
1.0000 | ORAL_TABLET | Freq: Four times a day (QID) | ORAL | Status: DC | PRN
  Administered 2013-04-12: 1 via ORAL

## 2013-04-12 MED ORDER — ONDANSETRON HCL 4 MG/2ML IJ SOLN
4.0000 mg | Freq: Four times a day (QID) | INTRAMUSCULAR | Status: DC | PRN
  Administered 2013-04-12: 4 mg via INTRAVENOUS

## 2013-04-12 MED ORDER — ACETAMINOPHEN 325 MG PO TABS: 650.0000 mg | ORAL_TABLET | ORAL | Status: DC | PRN

## 2013-04-12 MED ORDER — LIDOCAINE HCL (PF) 1 % IJ SOLN
INTRAMUSCULAR | Status: AC
  Filled 2013-04-12: qty 30

## 2013-04-12 MED ORDER — HEPARIN (PORCINE) IN NACL 2-0.9 UNIT/ML-% IJ SOLN
INTRAMUSCULAR | Status: AC
  Filled 2013-04-12: qty 1000

## 2013-04-12 MED ORDER — ONDANSETRON HCL 4 MG/2ML IJ SOLN
INTRAMUSCULAR | Status: AC
  Filled 2013-04-12: qty 2

## 2013-04-12 MED ORDER — SODIUM CHLORIDE 0.9 % IV SOLN: INTRAVENOUS | Status: AC

## 2013-04-12 MED ORDER — MORPHINE SULFATE 2 MG/ML IJ SOLN: 1.0000 mg | INTRAMUSCULAR | Status: DC | PRN

## 2013-04-12 NOTE — H&P (View-Only) (Signed)
 Patient ID: [name], female   DOB: 12/03/1922, 79 y.o.   MRN: [id number]  Clinic Note: HPI: [name] is a 79 y.o. female with a PMH below who presents today for evaluation of possible ischemic/infected left toe. She was referred by Dr. Fred Wilson from [facility] [place].She herself is not a very good historian. Most history comes from her daughters. She had a foot x-ray, but did not show any evidence of osteomyelitis. She is referred for possible ischemic toe with possible early gangrene  Interval History: She presents here with a main complaint of a dark blue, possibly infected left fourth toe. It is extremely painful and tender. GU there is no erythema coming out from it. It is not hot/warm. She denies any fevers or chills, no sweats. No claudication-type symptoms that I can derive from her. Her daughter does note that she complains sometimes of cramping in her legs, but she really does not walk around enough to note symptoms. She is relatively inactive, and currently now with her foot hurting her, she does not do much in way of any activity. But at baseline, she denies any chest discomfort or short of breath at rest or exertion. No PND, orthopnea or edema. No syncope or near-syncope, but she does have some mild orthostatic changes on occasion. No recent falls. No TIA or amaurosis fugax symptoms. She does have forgetfulness and confusion.   The remainder of cardiac review of systems is as follows: Cardiovascular ROS: no chest pain or dyspnea on exertion negative for - edema, irregular heartbeat, loss of consciousness, murmur, orthopnea, palpitations, paroxysmal nocturnal dyspnea, rapid heart rate or shortness of breath : Additional cardiac review of systems: She denies any melena, hematochezia or hematuria.   Past Medical History  Diagnosis Date  . Hypertension   . Dementia     Thought to be vascular  . Glaucoma   . Diverticulosis of colon     With  recurrent bouts of diverticulitis  . Irritable bowel syndrome (IBS)   . Hearing loss of aging   . Osteoporosis     Prior Cardiac Evaluation and Past Surgical History: Past Surgical History  Procedure Laterality Date  . Breast surgery    . Abdominal hysterectomy      Allergies  Allergen Reactions  . Codeine Itching and Other (See Comments)    hyper  . Demerol (Meperidine) Nausea Only  . Benzocaine Rash  . Darvon (Propoxyphene) Rash  . Penicillins Itching and Rash  . Phenobarbital Itching and Rash  . Sulfa Antibiotics Itching and Rash    Current Outpatient Prescriptions  Medication Sig Dispense Refill  . Cyanocobalamin (VITAMIN B 12 PO) Take 1,000 mg by mouth daily.      . diltiazem (CARDIZEM CD) 240 MG 24 hr capsule Take 240 mg by mouth daily.      . donepezil (ARICEPT) 5 MG tablet Take 2.5 mg by mouth every morning.       . losartan-hydrochlorothiazide (HYZAAR) 100-12.5 MG per tablet Take 1 tablet by mouth daily.      . Ascorbic Acid (VITAMIN C PO) Take 1 tablet by mouth daily.      . HYDROcodone-acetaminophen (NORCO/VICODIN) 5-325 MG per tablet Take 1 tablet by mouth every 6 (six) hours as needed for pain.  6 tablet  0  . Polyethyl Glycol-Propyl Glycol (SYSTANE OP) Place 1 drop into both eyes as needed (dry eyes).      . traMADol (ULTRAM) 50 MG tablet Take 50 mg by   mouth every 6 (six) hours as needed for pain.       No current facility-administered medications for this visit.    History   Social History  . Marital Status: Married    Spouse Name: N/A    Number of Children: 5  . Years of Education: N/A   Occupational History  . Retired Other    University teacher - [employer]   Social History Main Topics  . Smoking status: Never Smoker   . Smokeless tobacco: Not on file  . Alcohol Use: No  . Drug Use: No  . Sexually Active: Not on file   Other Topics Concern  . Not on file   Social History Narrative   Married mother of 5. Lives with her husband. Walks  occasionally with a cane. She previously worked as a teacher at [employer]. She is limited as far as her activity based on her arthritis.   History reviewed. No pertinent family history. she is not a good historian. Her daughters are not aware of any significant cardiovascular disease in her siblings or parents.  ROS: A comprehensive Review of Systems - Negative except Pertinent positives noted above including her forgetfulness, and chronic fatigue.  PHYSICAL EXAM BP 154/80  Pulse 65  Ht 5' 4" (1.626 m)  Wt 140 lb 8 oz (63.73 kg)  BMI 24.1 kg/m2 General appearance: alert, cooperative, appears stated age, no distress and She does seem to have some discomfort in her foot especially with examination. He tries to questions but is a very poor historian. She is a normal mood and affect, but has occasional blank stare as if she does not understand she is being asked Neck: no adenopathy, no carotid bruit, no JVD, supple, symmetrical, trachea midline and thyroid not enlarged, symmetric, no tenderness/mass/nodules Lungs: clear to auscultation bilaterally, normal percussion bilaterally and Nonlabored, good air movement. Heart: regular rate and rhythm, S1, S2 normal, no murmur, click, rub or gallop and normal apical impulse Abdomen: soft, non-tender; bowel sounds normal; no masses,  no organomegaly Extremities: No clubbing  or eeddemaa. Generally no cyanosis with the exception of the lateral aspect of the fourth digit on her left foot. It is dark with some mild discharge. Extremely painful it is right where the toe pushes against his neighboring toe. Very tender with any movement of the toe itself. There is no evidence of any spreading infection proximally. No erythema or warmth to touch. Pulses: She has very diminished popliteal and dorsalis pedis pulses on the left foot, with a slightly palpable/trace left posterior tibial pulse. She does a have soft left-sided femoral bruit. The  right-sided pulses are somewhat improved with 1+ pulses in the dorsalis pedis and posterior tibial pulses. Skin: Dark, eschar -- chronic ischemic toe Neurologic: Mental status: alertness: alert, orientation: person, place, city, affect: blunted Cranial nerves: normal HEENT: /AT, EOMI, MMM, anicteric sclera  EKG:Performed today: Yes Rate: 67 , Rhythm: Normal sinus rhythm, normal ECG   ASSESSMENT/PLAN: She has what appears to be a chronically ischemic left fourth toe. There is decreased pulses on this foot as no signs of active infection, but there is concern for possible gangrene. She needs wound care evaluation and assistance with dressings. I recommended that she place a gauze in between the 2 toes to avoid pressure. Also recommend loose dressing of the toe and to wear open toed yet protected shoes. I I have ordered bilateral lower extremity artery Dopplers with ABIs.  Her primary care provider has made recommendations   for pain management. As no active infection, I see no need for antibiotics. I'll see her back after her Dopplers.  However, if they're grossly abnormal, I would go ahead and refer her either to see Dr. Jonathan Berry here at [facility], or simply schedule her directly for more short angiography and possible percutaneous intervention. This would be with Dr. Berry as primary and myself as assistant operator.  She has absolute no active signs symptoms or coronary disease. As there is urgency to evaluation of her peripheral vascular disease, no flank a cardiac evaluation is necessary at this time, as well likely not affect our plan.  Ulcer of toe, left - Plan: Lower Extremity Arterial Duplex Bilateral, AMB referral to wound care center  HTN (hypertension)  Preoperative vascular examination  Ischemic toe- Lt 4 forth toe   Margurete Guaman W, M.D., M.S. [facility] [address] [place], [place]   [id number]  [phone] Pager # [phone] 04/06/2013 11:46 PM     

## 2013-04-12 NOTE — CV Procedure (Signed)
[name] is a 79 y.o. female    [id number] LOCATION:  FACILITY: [facility]  PHYSICIAN: Nanetta Batty, M.D. December 21, 1922   DATE OF PROCEDURE:  04/12/2013  DATE OF DISCHARGE:   CARDIAC CATHETERIZATION     History obtained from chart review. [name] is an 79 year old thin and frail-appearing Caucasian female referred to me by Dr. [name] Lemma because of critical limb ischemia. She was sent to him by Dr. Benedetto Goad for a gangrenous left fifth toe. Dopplers in our office revealed a left ABI in the 0.6 range with a high-frequency signal in the mid to distal left SFA and one-vessel runoff. She does have stage IV chronic renal insufficiency with a creatinine clearance in the 20-30 cc per minute range. She presents now for angiography to define her anatomy and suitability for percutaneous revascularization.   PROCEDURE DESCRIPTION:    The patient was brought to the second floor [place] [facility] cath lab in the postabsorptive state. She was premedicated with Valium 5 mg by mouth, IV Versed and fentanyl.her Right groinwas prepped and shaved in usual sterile fashion. Xylocaine 1% was used for local anesthesia. A 5 French sheath was inserted into the right common femoral artery using standard Seldinger technique. A 5 French pigtail catheter was used for bilateral iliac angiography, a 5 [place] crossover catheter and into the catheter were used for left lower extremity angiography using bolus chase digital subtraction settable technique. Visipaque dye was used for the entirety of the case. Retrograde aortic pressure was monitored during the case.   HEMODYNAMICS:    AO SYSTOLIC/AO DIASTOLIC: 164/51    ANGIOGRAPHIC RESULTS:   1: Bilateral iliac angiography-normal iliacs  2: Left lower extremity- there is a moderately long segment 7080% calcified stenosis in the mid left SFA with a focal 80% calcified stenosis in the left popliteal artery. There is 1 vessel runoff via the peroneal artery which  collateralizes the dorsalis pedis and posterior tibial at the level of the foot.  IMPRESSION:[name] has long segment calcified high-grade mid and distal left SFA stenosis and critical limb ischemia. I used 56 cc of contrast for the diagnostic portion of this case. Patient rehydrated and discharged home. She'll be brought back for diamondback orbital rotational atherectomy, plus or minus PTA and stenting using distal protection. She'll be admitted to medical floor for hydration the evening before .  Runell Gess MD, [facility] 04/12/2013 8:22 AM

## 2013-04-12 NOTE — Interval H&P Note (Signed)
History and Physical Interval Note:  04/12/2013 7:38 AM  [name]  has presented today for surgery, with the diagnosis of Left 5th toe possible gangrene & Doppler evidence of severe L SFA stenosis - critical limb ischemia.  The various methods of treatment have been discussed with the patient and family. After consideration of risks, benefits and other options for treatment, the patient has consented to  Procedure(s): LOWER EXTREMITY ANGIOGRAM (N/A)with possible percutaneous intervention as a surgical intervention .    The patient's history has been reviewed, patient examined, no change in status, stable for surgery.  I have reviewed the patient's chart and labs.  Questions were answered to the patient's satisfaction.  Since her latest visit, she has had her LEA Dopplers done confirming distal LSFA & sub-popliteal disease as possible culprits for critical limb ischemia.   I have explained the procedure with the risks, benefits, alternatives and indications of the procedure to her & her family (husband, daughter).  She agrees to proceed.  HARDING,DAVID W

## 2013-04-12 NOTE — Progress Notes (Signed)
Pt up to bathroom with assistance, rt groin level 0 will monitor

## 2013-04-13 ENCOUNTER — Encounter (HOSPITAL_COMMUNITY): Payer: Self-pay | Admitting: Respiratory Therapy

## 2013-04-17 ENCOUNTER — Inpatient Hospital Stay (HOSPITAL_COMMUNITY): Payer: Medicare Other

## 2013-04-17 ENCOUNTER — Observation Stay (HOSPITAL_COMMUNITY)
Admission: AD | Admit: 2013-04-17 | Discharge: 2013-04-19 | Disposition: A | Discharge status: Home / Self Care | Payer: Medicare Other | Source: Ambulatory Visit | Attending: Cardiovascular Disease | Admitting: Cardiovascular Disease

## 2013-04-17 ENCOUNTER — Encounter (HOSPITAL_COMMUNITY): Payer: Self-pay

## 2013-04-17 DIAGNOSIS — I998 Other disorder of circulatory system: Secondary | ICD-10-CM | POA: Diagnosis present

## 2013-04-17 DIAGNOSIS — F0390 Unspecified dementia without behavioral disturbance: Secondary | ICD-10-CM | POA: Insufficient documentation

## 2013-04-17 DIAGNOSIS — I70229 Atherosclerosis of native arteries of extremities with rest pain, unspecified extremity: Secondary | ICD-10-CM | POA: Diagnosis present

## 2013-04-17 DIAGNOSIS — I70269 Atherosclerosis of native arteries of extremities with gangrene, unspecified extremity: Principal | ICD-10-CM | POA: Insufficient documentation

## 2013-04-17 DIAGNOSIS — N183 Chronic kidney disease, stage 3 unspecified: Secondary | ICD-10-CM

## 2013-04-17 DIAGNOSIS — I739 Peripheral vascular disease, unspecified: Secondary | ICD-10-CM

## 2013-04-17 DIAGNOSIS — I110 Hypertensive heart disease with heart failure: Secondary | ICD-10-CM | POA: Diagnosis present

## 2013-04-17 DIAGNOSIS — I129 Hypertensive chronic kidney disease with stage 1 through stage 4 chronic kidney disease, or unspecified chronic kidney disease: Secondary | ICD-10-CM | POA: Insufficient documentation

## 2013-04-17 DIAGNOSIS — F0391 Unspecified dementia with behavioral disturbance: Secondary | ICD-10-CM | POA: Diagnosis present

## 2013-04-17 DIAGNOSIS — Z79899 Other long term (current) drug therapy: Secondary | ICD-10-CM | POA: Insufficient documentation

## 2013-04-17 HISTORY — DX: Other specified postprocedural states: Z98.890

## 2013-04-17 HISTORY — DX: Other disorder of circulatory system: I99.8

## 2013-04-17 HISTORY — DX: Atherosclerosis of native arteries of extremities with rest pain, unspecified extremity: I70.229

## 2013-04-17 HISTORY — DX: Peripheral vascular disease, unspecified: I73.9

## 2013-04-17 HISTORY — DX: Other specified postprocedural states: R11.2

## 2013-04-17 HISTORY — DX: Chronic kidney disease, stage 3 unspecified: N18.30

## 2013-04-17 HISTORY — DX: Chronic kidney disease, stage 3 (moderate): N18.3

## 2013-04-17 LAB — CBC WITH DIFFERENTIAL/PLATELET
Basophils Absolute: 0.1 10*3/uL (ref 0.0–0.1)
Eosinophils Absolute: 0.6 10*3/uL (ref 0.0–0.7)
Eosinophils Relative: 8 % — ABNORMAL HIGH (ref 0–5)
Lymphs Abs: 2.1 10*3/uL (ref 0.7–4.0)
MCH: 31.4 pg (ref 26.0–34.0)
MCV: 93.8 fL (ref 78.0–100.0)
Monocytes Absolute: 0.9 10*3/uL (ref 0.1–1.0)
Platelets: 251 10*3/uL (ref 150–400)
RDW: 12.7 % (ref 11.5–15.5)

## 2013-04-17 LAB — PROTIME-INR
INR: 1 (ref 0.00–1.49)
Prothrombin Time: 13 seconds (ref 11.6–15.2)

## 2013-04-17 LAB — BASIC METABOLIC PANEL
BUN: 19 mg/dL (ref 6–23)
Calcium: 9.9 mg/dL (ref 8.4–10.5)
Chloride: 99 mEq/L (ref 96–112)
Creatinine, Ser: 1.41 mg/dL — ABNORMAL HIGH (ref 0.50–1.10)
GFR calc Af Amer: 45 mL/min — ABNORMAL LOW (ref >90)
GFR calc non Af Amer: 32 mL/min — ABNORMAL LOW (ref >90)
GFR calc non Af Amer: 39 mL/min — ABNORMAL LOW (ref >90)
Glucose, Bld: 107 mg/dL — ABNORMAL HIGH (ref 70–99)
Potassium: 3.6 mEq/L (ref 3.5–5.1)
Sodium: 137 mEq/L (ref 135–145)
Sodium: 135 mEq/L (ref 135–145)

## 2013-04-17 LAB — CBC
HCT: 35.6 % — ABNORMAL LOW (ref 36.0–46.0)
Platelets: 248 10*3/uL (ref 150–400)
RDW: 12.8 % (ref 11.5–15.5)
WBC: 8.4 10*3/uL (ref 4.0–10.5)

## 2013-04-17 MED ORDER — POTASSIUM CHLORIDE CRYS ER 20 MEQ PO TBCR
20.0000 meq | EXTENDED_RELEASE_TABLET | Freq: Once | ORAL | Status: AC
  Administered 2013-04-17: 20 meq via ORAL
  Filled 2013-04-17: qty 1

## 2013-04-17 MED ORDER — SODIUM CHLORIDE 0.9 % IV SOLN
INTRAVENOUS | Status: DC
  Administered 2013-04-17: 18:00:00 via INTRAVENOUS
  Administered 2013-04-18: 75 mL/h via INTRAVENOUS

## 2013-04-17 MED ORDER — DILTIAZEM HCL ER COATED BEADS 240 MG PO CP24
240.0000 mg | ORAL_CAPSULE | Freq: Every day | ORAL | Status: DC
  Administered 2013-04-18 – 2013-04-19 (×2): 240 mg via ORAL
  Filled 2013-04-17 (×2): qty 1

## 2013-04-17 MED ORDER — ASPIRIN EC 81 MG PO TBEC
81.0000 mg | DELAYED_RELEASE_TABLET | Freq: Every day | ORAL | Status: DC
Start: 2013-04-18 — End: 2013-04-18
  Filled 2013-04-17: qty 1

## 2013-04-17 MED ORDER — DIPHENHYDRAMINE HCL 50 MG/ML IJ SOLN
25.0000 mg | Freq: Once | INTRAMUSCULAR | Status: AC
  Administered 2013-04-18: 25 mg via INTRAVENOUS
  Filled 2013-04-17: qty 1

## 2013-04-17 MED ORDER — ASPIRIN EC 81 MG PO TBEC: 81.0000 mg | DELAYED_RELEASE_TABLET | Freq: Every day | ORAL | Status: DC

## 2013-04-17 MED ORDER — ASPIRIN 81 MG PO TABS: 81.0000 mg | ORAL_TABLET | Freq: Every day | ORAL | Status: DC

## 2013-04-17 MED ORDER — SODIUM CHLORIDE 0.9 % IV SOLN: 250.0000 mL | INTRAVENOUS | Status: DC | PRN

## 2013-04-17 MED ORDER — SODIUM CHLORIDE 0.9 % IJ SOLN: 3.0000 mL | Freq: Two times a day (BID) | INTRAMUSCULAR | Status: DC

## 2013-04-17 MED ORDER — ASPIRIN 81 MG PO CHEW
324.0000 mg | CHEWABLE_TABLET | ORAL | Status: AC
  Administered 2013-04-18: 324 mg via ORAL
  Filled 2013-04-17: qty 4

## 2013-04-17 MED ORDER — HYDROCODONE-ACETAMINOPHEN 5-325 MG PO TABS
1.0000 | ORAL_TABLET | Freq: Four times a day (QID) | ORAL | Status: DC | PRN
  Administered 2013-04-17: 1 via ORAL
  Filled 2013-04-17: qty 1

## 2013-04-17 MED ORDER — SODIUM CHLORIDE 0.9 % IJ SOLN: 3.0000 mL | INTRAMUSCULAR | Status: DC | PRN

## 2013-04-17 MED ORDER — SODIUM CHLORIDE 0.9 % IJ SOLN
3.0000 mL | Freq: Two times a day (BID) | INTRAMUSCULAR | Status: DC
  Administered 2013-04-17: 3 mL via INTRAVENOUS

## 2013-04-17 MED ORDER — ENOXAPARIN SODIUM 30 MG/0.3ML ~~LOC~~ SOLN
30.0000 mg | SUBCUTANEOUS | Status: AC
  Administered 2013-04-17: 30 mg via SUBCUTANEOUS
  Filled 2013-04-17: qty 0.3

## 2013-04-17 MED ORDER — DONEPEZIL HCL 5 MG PO TABS
2.5000 mg | ORAL_TABLET | Freq: Every day | ORAL | Status: DC
  Administered 2013-04-18 – 2013-04-19 (×2): 2.5 mg via ORAL
  Filled 2013-04-17 (×2): qty 0.5

## 2013-04-17 MED ORDER — ACETAMINOPHEN 325 MG PO TABS: 650.0000 mg | ORAL_TABLET | ORAL | Status: DC | PRN

## 2013-04-17 NOTE — H&P (Signed)
[place] Pavlich is an 79 y.o. female.    Primary Cardiologist:Dr. Herbie Baltimore PCP: Pamelia Hoit, MD  Chief Complaint: critical limb ischemia with ulcer on Lt 5th toe and ischemic HPI: [place] is a 79 y.o. female with a PMH below seen by Dr. Herbie Baltimore for evaluation of possible ischemic/infected left toe. She was referred by Dr. Benedetto Goad from [facility] [place].She herself is not a very good historian. Most history comes from her daughters.  She had a foot x-ray, but did not show any evidence of osteomyelitis. She is referred for possible ischemic toe with possible early gangrene   She presented with main complaint of a dark blue, possibly infected left fourth toe. It is extremely painful and tender. No erythema, not hot/warm. She denies any fevers or chills, no sweats. No claudication-type symptoms. Her daughter does note that she complains sometimes of cramping in her legs, but she really does not walk around enough to note symptoms.   She denies any chest discomfort or short of breath at rest or exertion. No PND, orthopnea or edema. No syncope or near-syncope, but she does have some mild orthostatic changes on occasion. No recent falls. No TIA or amaurosis fugax symptoms. She does have forgetfulness and confusion.   Dopplers were done with Lt ABI in the 0.6 range with a high-frequency signal in the mid to distal left SFA and one-vessel runoff. She does have stage IV chronic renal insufficiency with a creatinine clearance in the 20-30 cc per minute range.  PV angio was done with normal iliacs and with left lower extremity- there is a moderately long segment 7080% calcified stenosis in the mid left SFA with a focal 80% calcified stenosis in the left popliteal artery. There is 1 vessel runoff via the peroneal artery which collateralizes the dorsalis pedis and posterior tibial at the level of the foot.   With the long segment calcified high-grade mid and distal  left SFA stenosis and critical limb ischemia she is admitted to undergo  diamondback orbital rotational atherectomy, plus or minus PTA and stenting using distal protection. She is being admitted to medical floor for hydration the evening before.    Past Medical History  Diagnosis Date  . Hypertension   . Dementia     Thought to be vascular  . Glaucoma   . Diverticulosis of colon     With recurrent bouts of diverticulitis  . Irritable bowel syndrome (IBS)   . Hearing loss of aging   . Osteoporosis   . PONV (postoperative nausea and vomiting)   . Critical lower limb ischemia 04/17/2013  . PAD (peripheral artery disease), with Lt SFA stenosis 04/17/2013  . CKD (chronic kidney disease) stage 3, GFR 30-59 ml/min 04/17/2013    Past Surgical History  Procedure Laterality Date  . Abdominal hysterectomy    . Breast surgery  1968    removal of non cancerous lump    Family History  Problem Relation Age of Onset  . Family history unknown: Yes  she does not know cause of death of her parents  Social History:  reports that she has never smoked. She has never used smokeless tobacco. She reports that she does not drink alcohol or use illicit drugs. Married, with children  Allergies:  Allergies  Allergen Reactions  . Codeine Itching and Other (See Comments)    hyper  . Demerol (Meperidine) Nausea Only  . Benzocaine Rash  . Darvon (Propoxyphene) Rash  . Penicillins Itching  and Rash  . Phenobarbital Itching and Rash  . Sulfa Antibiotics Itching and Rash    Medications Prior to Admission  Medication Sig Dispense Refill  . acetaminophen (TYLENOL) 325 MG tablet Take 2 tablets (650 mg total) by mouth every 4 (four) hours as needed.      Marland Kitchen aspirin 81 MG tablet Take 81 mg by mouth daily.      . Cyanocobalamin (VITAMIN B 12 PO) Take 1,000 mg by mouth daily.      Marland Kitchen diltiazem (CARDIZEM CD) 240 MG 24 hr capsule Take 240 mg by mouth daily.      Marland Kitchen donepezil (ARICEPT) 5 MG tablet Take 2.5 mg by  mouth every morning.       Marland Kitchen HYDROcodone-acetaminophen (NORCO/VICODIN) 5-325 MG per tablet Take 1 tablet by mouth every 6 (six) hours as needed for pain.  30 tablet  0  . losartan-hydrochlorothiazide (HYZAAR) 100-12.5 MG per tablet Take 1 tablet by mouth daily.      Bertram Gala Glycol-Propyl Glycol (SYSTANE OP) Place 1 drop into both eyes as needed (dry eyes).        ROS: General:no colds or fevers, no weight changes Skin:no rashes or ulcers HEENT:no blurred vision, no congestion CV:see HPI PUL:see HPI GI:no diarrhea constipation or melena, no indigestion GU:no hematuria, no dysuria MS:no joint pain, no claudication Neuro:no syncope, no lightheadedness Endo:no diabetes, no thyroid disease   Blood pressure 138/43, pulse 64, temperature 97.8 F (36.6 C), temperature source Oral, resp. rate 16, height 5\' 3"  (1.6 m), weight 139 lb 14.4 oz (63.458 kg), SpO2 96.00%. PE: General:Pleasant affect, NAD Skin:Warm and dry, brisk capillary refill HEENT:normocephalic, sclera clear, mucus membranes moist Neck:supple, no JVD, no bruits  Heart:S1S2 RRR without murmur, gallup, rub or click Lungs:clear without rales, rhonchi, or wheezes WUJ:WJXB, non tender, + BS, do not palpate liver spleen or masses Ext:no lower ext edema, ? pedal pulses, 2+ radial pulses Neuro:alert and oriented, MAE, follows commands, + facial symmetry    Assessment/Plan Principal Problem:   Critical lower limb ischemia Active Problems:   PAD (peripheral artery disease), with Lt SFA stenosis   Ischemic toe- Lt 4 forth toe   HTN (hypertension)   Dementia   CKD (chronic kidney disease) stage 3, GFR 30-59 ml/min  PLAN: brought in for hydration in 79 year old with CKD and need for diamond back arthrectomy on Lt SFA in AM by Dr. Allyson Sabal.  Pt without changes since PV angiogram last week.  Her daughter and husband are here with her.  She has not yet been set up for wound care.  Will continue home treatment of toe ulcer and ask wound  care to see here.  [facility] R Nurse Practitioner Certified [facility] and Vascular Pager [phone] 04/17/2013, 4:11 PM

## 2013-04-18 ENCOUNTER — Other Ambulatory Visit: Payer: Self-pay | Admitting: Cardiology

## 2013-04-18 ENCOUNTER — Encounter (HOSPITAL_COMMUNITY)
Admission: AD | Disposition: A | Discharge status: Home / Self Care | Payer: Self-pay | Source: Ambulatory Visit | Attending: Cardiovascular Disease

## 2013-04-18 DIAGNOSIS — I998 Other disorder of circulatory system: Secondary | ICD-10-CM

## 2013-04-18 DIAGNOSIS — I70219 Atherosclerosis of native arteries of extremities with intermittent claudication, unspecified extremity: Secondary | ICD-10-CM

## 2013-04-18 HISTORY — PX: ANGIOPLASTY / STENTING FEMORAL: SUR30

## 2013-04-18 HISTORY — PX: LOWER EXTREMITY ANGIOGRAM: SHX5508

## 2013-04-18 LAB — HEPATIC FUNCTION PANEL
Albumin: 3.1 g/dL — ABNORMAL LOW (ref 3.5–5.2)
Total Protein: 7 g/dL (ref 6.0–8.3)

## 2013-04-18 LAB — POCT ACTIVATED CLOTTING TIME: Activated Clotting Time: 227 seconds

## 2013-04-18 SURGERY — ANGIOGRAM, LOWER EXTREMITY
Anesthesia: Moderate Sedation | Laterality: Bilateral

## 2013-04-18 MED ORDER — SODIUM CHLORIDE 0.9 % IV SOLN
INTRAVENOUS | Status: AC
  Administered 2013-04-18: 14:00:00 via INTRAVENOUS
  Administered 2013-04-18: 22:00:00 500 mL via INTRAVENOUS

## 2013-04-18 MED ORDER — HEPARIN SODIUM (PORCINE) 1000 UNIT/ML IJ SOLN
INTRAMUSCULAR | Status: AC
  Filled 2013-04-18: qty 1

## 2013-04-18 MED ORDER — FENTANYL CITRATE 0.05 MG/ML IJ SOLN
INTRAMUSCULAR | Status: AC
  Filled 2013-04-18: qty 2

## 2013-04-18 MED ORDER — CLOPIDOGREL BISULFATE 300 MG PO TABS
ORAL_TABLET | ORAL | Status: AC
  Filled 2013-04-18: qty 1

## 2013-04-18 MED ORDER — ACETAMINOPHEN 325 MG PO TABS: 650.0000 mg | ORAL_TABLET | ORAL | Status: DC | PRN

## 2013-04-18 MED ORDER — NITROGLYCERIN 0.2 MG/ML ON CALL CATH LAB
INTRAVENOUS | Status: AC
  Filled 2013-04-18: qty 1

## 2013-04-18 MED ORDER — ONDANSETRON HCL 4 MG/2ML IJ SOLN
4.0000 mg | Freq: Four times a day (QID) | INTRAMUSCULAR | Status: DC | PRN
  Administered 2013-04-18: 16:00:00 4 mg via INTRAVENOUS
  Filled 2013-04-18: qty 2

## 2013-04-18 MED ORDER — CLOPIDOGREL BISULFATE 75 MG PO TABS
75.0000 mg | ORAL_TABLET | Freq: Every day | ORAL | Status: DC
  Administered 2013-04-19: 75 mg via ORAL
  Filled 2013-04-18: qty 1

## 2013-04-18 MED ORDER — HYDRALAZINE HCL 20 MG/ML IJ SOLN
10.0000 mg | INTRAMUSCULAR | Status: DC
  Administered 2013-04-18: 15:00:00 10 mg via INTRAVENOUS
  Filled 2013-04-18: qty 1

## 2013-04-18 MED ORDER — MORPHINE SULFATE 2 MG/ML IJ SOLN: 1.0000 mg | INTRAMUSCULAR | Status: DC | PRN

## 2013-04-18 MED ORDER — ASPIRIN EC 325 MG PO TBEC
325.0000 mg | DELAYED_RELEASE_TABLET | Freq: Every day | ORAL | Status: DC
  Filled 2013-04-18: qty 1

## 2013-04-18 MED ORDER — VERAPAMIL HCL 2.5 MG/ML IV SOLN
INTRAVENOUS | Status: AC
  Filled 2013-04-18: qty 2

## 2013-04-18 NOTE — Care Management Note (Signed)
    Page 1 of 1   04/20/2013     1:40:02 PM   CARE MANAGEMENT NOTE 04/20/2013  Patient:  [name],[name]   Account Number:  [id number]  Date Initiated:  04/18/2013  Documentation initiated by:  AMERSON,JULIE  Subjective/Objective Assessment:   PT WITH PVD ADM FOR ARTERIOGRAM.  PTA, PT RESIDES AT HOME WITH SPOUSE.     Action/Plan:   WILL FOLLOW FOR HOME NEEDS AS PT PROGRESSES.   Anticipated DC Date:  04/19/2013   Anticipated DC Plan:  HOME/SELF CARE      DC Planning Services  CM consult      Choice offered to / List presented to:     DME arranged  [facility]      DME agency  [facility]        Status of service:  In process, will continue to follow Medicare Important Message given?   (If response is "NO", the following Medicare IM given date fields will be blank) Date Medicare IM given:   Date Additional Medicare IM given:    Discharge Disposition:    Per UR Regulation:  Reviewed for med. necessity/level of care/duration of stay  If discussed at Long Length of Stay Meetings, dates discussed:    Comments:  04/20/13 @ 1515.Marland KitchenMarland KitchenOletta Cohn, RN, BSN, [place] [phone] Spoke with Pt RN Vernona Rieger Cox) on 7/15 regarding pt needing BSC at discharge.  NCM placed order @ 1527.  At 1715, Victorino December called NCM stating that pt was still awaiting [facility] for discharge home.  NCM called [facility], spoke with Ebony to set up delivery of [facility] for pt. Victorino December faxed orders to [facility] at [facility] and had [facility] delivered to hospital prior to pt D/C home.

## 2013-04-18 NOTE — Progress Notes (Signed)
Subjective: Left toe sore.  No other complaints.  Objective: Vital signs in last 24 hours: Temp:  [97.8 F (36.6 C)-98.4 F (36.9 C)] 98.3 F (36.8 C) (07/14 0342) Pulse Rate:  [62-66] 66 (07/14 0342) Resp:  [16-20] 20 (07/14 0342) BP: (138-149)/(42-46) 148/46 mmHg (07/14 0342) SpO2:  [95 %-97 %] 95 % (07/14 0342) Weight:  [139 lb 14.4 oz (63.458 kg)-141 lb 5 oz (64.1 kg)] 141 lb 5 oz (64.1 kg) (07/14 0700) Last BM Date: 04/16/13  Intake/Output from previous day: 07/13 0701 - 07/14 0700 In: -  Out: 1450 [Urine:1450] Intake/Output this shift:    Medications Current Facility-Administered Medications  Medication Dose Route Frequency Provider Last Rate Last Dose  . 0.9 %  sodium chloride infusion   Intravenous Continuous Julie Boozer, NP 75 mL/hr at 04/18/13 0603 75 mL/hr at 04/18/13 0603  . 0.9 %  sodium chloride infusion  250 mL Intravenous PRN Julie Boozer, NP      . acetaminophen (TYLENOL) tablet 650 mg  650 mg Oral Q4H PRN Julie Boozer, NP      . aspirin EC tablet 81 mg  81 mg Oral Daily Julie Gess, MD      . diltiazem (CARDIZEM CD) 24 hr capsule 240 mg  240 mg Oral Daily Julie Boozer, NP      . diphenhydrAMINE (BENADRYL) injection 25 mg  25 mg Intravenous Once Julie Boozer, NP      . donepezil (ARICEPT) tablet 2.5 mg  2.5 mg Oral Daily Julie Boozer, NP      . HYDROcodone-acetaminophen (NORCO/VICODIN) 5-325 MG per tablet 1 tablet  1 tablet Oral Q6H PRN Julie Boozer, NP   1 tablet at 04/17/13 2257  . sodium chloride 0.9 % injection 3 mL  3 mL Intravenous Q12H Julie Boozer, NP   3 mL at 04/17/13 2211  . sodium chloride 0.9 % injection 3 mL  3 mL Intravenous Q12H Julie Boozer, NP      . sodium chloride 0.9 % injection 3 mL  3 mL Intravenous PRN Julie Boozer, NP        PE: General appearance: alert, cooperative and no distress Lungs: clear to auscultation bilaterally Heart: regular rate and rhythm, S1, S2 normal, no murmur, click, rub or gallop Extremities: No  LEE Pulses: 2+ and symmetric 1+ right DP, 0 right PT.   0 left DP and PT.  Both LE are warm. Skin: Warm and dry Neurologic: Grossly normal  Lab Results:   Recent Labs  04/17/13 1633 04/17/13 1816  WBC 7.6 8.4  HGB 11.1* 12.0  HCT 33.1* 35.6*  PLT 251 248   BMET  Recent Labs  04/17/13 1633 04/17/13 1816  NA 137 135  K 3.5 3.6  CL 99 99  CO2 28 28  GLUCOSE 107* 122*  BUN 20 19  CREATININE 1.41* 1.20*  CALCIUM 9.9 9.6   PT/INR  Recent Labs  04/17/13 1816  LABPROT 13.0  INR 1.00      Assessment/Plan  Principal Problem:   Critical lower limb ischemia Active Problems:   Ischemic toe- Lt 4 forth toe   HTN (hypertension)   Dementia   PAD (peripheral artery disease), with Lt SFA stenosis   CKD (chronic kidney disease) stage 3, GFR 30-59 ml/min  Plan:  [name] arthrectomy on Lt SFA with Dr. Allyson Long today.  Improved SCr. To 1.2.    LOS: 1 day    [name] 04/18/2013 9:24 AM   Agree with note written by  [name] [facility]  Critical limb ischemia admitted yesterday for hydration now Scr 1.2. Scheduled DB atherectomy today.  [name] 04/19/2013 5:46 AM

## 2013-04-18 NOTE — Consult Note (Addendum)
WOC consult Note Reason for Consult: Consult requested for left 5th toe.   Wound type: Full thickness Measurement: 1X.5cm Wound bed:100% eschar, no odor or drainage Drainage (amount, consistency, odor)  Periwound: Intact skin surrounding Dressing procedure/placement/frequency: Pt has been referred to the outpatient wound care center by her primary physician, according to husband at bedside; but has not had her first appointment.  She has been applying neosporin Q day to area.  ABI is .6.  Topical care will be minimally effective in promoting healing to eschar.  Goal is to protect from further injury.  Please refer to [facility] for further plan of care. Agree with present plan of care with neosporin to prevent dressing from sticking to wound bed; this has already been ordered by NP. Pt can begin follow-up at outpatient wound care center after discharge. Please re-consult if further assistance is needed.  Thank-you,  Cammie Mcgee MSN, RN, CWOCN, [place], CNS [phone]

## 2013-04-18 NOTE — H&P (Signed)
    Pt was reexamined and existing H & P reviewed. No changes found.  Runell Gess, MD [facility] 04/18/2013 11:20 AM

## 2013-04-18 NOTE — Progress Notes (Signed)
Site area: right groin  Site Prior to Removal:  Level 0  Pressure Applied For 20 MINUTES    Minutes Beginning at 1510  Manual:   yes  Patient Status During Pull:  stable  Post Pull Groin Site:  Level 0  Post Pull Instructions Given:  yes  Post Pull Pulses Present:  yes  Dressing Applied:  yes  Comments:  Gauze pressure dressing applied. Rechecked q 15 x 4 with no change, dressing dry and intact and remains level 0.

## 2013-04-18 NOTE — CV Procedure (Signed)
[name] is a 79 y.o. female    [id number] LOCATION:  FACILITY: [facility]  PHYSICIAN: Nanetta Batty, M.D. 13-Dec-1922   DATE OF PROCEDURE:  04/18/2013  DATE OF DISCHARGE:   CARDIAC CATHETERIZATION     History obtained from chart review. [name] is an 79 year old frail-appearing female patient of Dr. Onalee Hua Harding's and Dr. Rosina Lowenstein referred for critical limb ischemia. She has chronic renal insufficiency. She has a nonhealing ulcer over her toes. I angiogrammed her last week revealing high-grade moderately long segmental calcified mid and distal left SFA and popliteal artery stenosis with one vessel runoff via the peroneal artery. She was admitted last night for hydration and presents now for diamondback orbital rotational atherectomy, PTA plus or minus stenting using IDEV subtending stent for critical limb ischemia.   PROCEDURE DESCRIPTION:    The patient was brought to the second floor [facility] cath lab in the postabsorptive state. She was premedicated with Valium 5 mg by mouth, IV fentanyl. Her right groin was prepped and shaved in usual sterile fashion. Xylocaine 1% was used for local anesthesia. A 5 French sheath was inserted into the right common femoral  artery using standard Seldinger technique. A 5 French crossover catheter was used to obtain access to the contralateral limb and following this a 7 [place], 55 cm long and suAnsel ple sheath was then advanced over the iliac bifurcation over an 035 Versicore wire. Visipaque dye was used for the entirety of the case. Retrograde aortic pressure was monitored during the case.   HEMODYNAMICS:    AO SYSTOLIC/AO DIASTOLIC: 168/59    ANGIOGRAPHIC RESULTS/ procedure description:  The patient received a total of 6500 units of heparin with an ACT of 227. Total contrast administered the patient was 118 cc. A long segment calcified stenosis was crossed with an 014/300 cm long Sparta core wire through a 18 Quick Cross  Endhole  Catheter. The Sparta core wire was then removed and replaced with a Viper Wire . Following this diamondback orbital rotational atherectomy was performed with a 2 mm classic burr up to 120,000 rpm's. I then performed PTA using a 5 mm x 120 mm long chocolate balloon resulting in a suboptimal angioplasty result with obvious one linear dissections. The decision was then made to implant an IDEV self expanding stent and the artery was prepared with a 6 mm x 150 mm long balloon. A 5.5 mm x 150 mm long IDEV  stent was then deployed carefully under angiographic guidance over the entirety of the disease segment beginning in the above-the-knee popliteal extending to the mid SFA. Completion angiography revealed a defect to be widely patent. The one-vessel runoff via the peroneal remained intact. Be noted that distal protection was provided with a large NAV 6 filter which was successfully retrieved at the end of the case. The Ansel Sheath  was then withdrawn across the bifurcation and exchanged for a short 7 [place] sheath. The patient received 300 mg of by mouth Plavix.    IMPRESSION:successful diamondback orbital rotational  atherectomy, PTA and stenting using a chocolate balloon, IDEV  Stent with an excellent angiographic results. Patient tolerated the procedure well. She left the Cath Lab in stable condition. She'll be hydrated overnight. The sheath will be removed once the ACT falls below 170. She'll get followup Dopplers in our office after which she will see me back in the office.  Runell Gess MD, [facility] 04/18/2013 1:04 PM

## 2013-04-19 ENCOUNTER — Other Ambulatory Visit: Payer: Self-pay | Admitting: Cardiology

## 2013-04-19 ENCOUNTER — Encounter (HOSPITAL_COMMUNITY): Payer: Self-pay | Admitting: Cardiology

## 2013-04-19 DIAGNOSIS — N183 Chronic kidney disease, stage 3 unspecified: Secondary | ICD-10-CM

## 2013-04-19 DIAGNOSIS — I998 Other disorder of circulatory system: Secondary | ICD-10-CM

## 2013-04-19 DIAGNOSIS — I999 Unspecified disorder of circulatory system: Secondary | ICD-10-CM

## 2013-04-19 DIAGNOSIS — I70229 Atherosclerosis of native arteries of extremities with rest pain, unspecified extremity: Secondary | ICD-10-CM

## 2013-04-19 LAB — BASIC METABOLIC PANEL
BUN: 13 mg/dL (ref 6–23)
CO2: 22 mEq/L (ref 19–32)
Chloride: 103 mEq/L (ref 96–112)
GFR calc Af Amer: 55 mL/min — ABNORMAL LOW (ref >90)
Potassium: 3.8 mEq/L (ref 3.5–5.1)

## 2013-04-19 LAB — CBC
HCT: 33.3 % — ABNORMAL LOW (ref 36.0–46.0)
RBC: 3.59 MIL/uL — ABNORMAL LOW (ref 3.87–5.11)
RDW: 12.8 % (ref 11.5–15.5)
WBC: 10.7 10*3/uL — ABNORMAL HIGH (ref 4.0–10.5)

## 2013-04-19 LAB — POCT ACTIVATED CLOTTING TIME: Activated Clotting Time: 176 seconds

## 2013-04-19 MED ORDER — CLOPIDOGREL BISULFATE 75 MG PO TABS: 75.0000 mg | ORAL_TABLET | Freq: Every day | ORAL | Status: DC

## 2013-04-19 MED ORDER — ENSURE COMPLETE PO LIQD: 237.0000 mL | Freq: Two times a day (BID) | ORAL | Status: DC

## 2013-04-19 MED ORDER — LISINOPRIL 5 MG PO TABS
5.0000 mg | ORAL_TABLET | Freq: Every day | ORAL | Status: DC
  Administered 2013-04-19: 11:00:00 5 mg via ORAL
  Filled 2013-04-19: qty 1

## 2013-04-19 MED ORDER — ADULT MULTIVITAMIN W/MINERALS CH
1.0000 | ORAL_TABLET | Freq: Every day | ORAL | Status: DC
Start: 2013-04-19 — End: 2013-04-19
  Filled 2013-04-19: qty 1

## 2013-04-19 MED ORDER — ASPIRIN EC 81 MG PO TBEC
81.0000 mg | DELAYED_RELEASE_TABLET | Freq: Every day | ORAL | Status: DC
  Administered 2013-04-19: 11:00:00 81 mg via ORAL
  Filled 2013-04-19: qty 1

## 2013-04-19 MED ORDER — LISINOPRIL 5 MG PO TABS: 5.0000 mg | ORAL_TABLET | Freq: Every day | ORAL | Status: DC

## 2013-04-19 MED ORDER — BIOTENE DRY MOUTH MT LIQD
15.0000 mL | Freq: Two times a day (BID) | OROMUCOSAL | Status: DC
  Administered 2013-04-19: 15 mL via OROMUCOSAL

## 2013-04-19 MED ORDER — ENSURE COMPLETE PO LIQD
237.0000 mL | Freq: Two times a day (BID) | ORAL | Status: DC
  Filled 2013-04-19 (×3): qty 237

## 2013-04-19 NOTE — Progress Notes (Signed)
Subjective:  Weak, requires lots of assistance to get from chair to bed.  Objective:  Vital Signs in the last 24 hours: Temp:  [98 F (36.7 C)-98.8 F (37.1 C)] 98.8 F (37.1 C) (07/15 0332) Pulse Rate:  [56-93] 93 (07/15 0332) Resp:  [14-17] 17 (07/15 0332) BP: (122-175)/(30-56) 145/54 mmHg (07/15 0332) SpO2:  [94 %-98 %] 94 % (07/15 0332) Weight:  [62.8 kg (138 lb 7.2 oz)] 62.8 kg (138 lb 7.2 oz) (07/15 0008)  Intake/Output from previous day:  Intake/Output Summary (Last 24 hours) at 04/19/13 0802 Last data filed at 04/19/13 0336  Gross per 24 hour  Intake    880 ml  Output   1500 ml  Net   -620 ml    Physical Exam: General appearance: alert, cooperative, no distress and thin, frail Lungs: clear to auscultation bilaterally Heart: regular rate and rhythm and 1/6 systolic murmur   Rate: 110  Rhythm: sinus tachycardia  Lab Results:  Recent Labs  04/17/13 1816 04/19/13 0535  WBC 8.4 10.7*  HGB 12.0 11.2*  PLT 248 230    Recent Labs  04/17/13 1816 04/19/13 0535  NA 135 136  K 3.6 3.8  CL 99 103  CO2 28 22  GLUCOSE 122* 105*  BUN 19 13  CREATININE 1.20* 1.02   No results found for this basename: TROPONINI, CK, MB,  in the last 72 hours Hepatic Function Panel  Recent Labs  04/18/13 0624  PROT 7.0  ALBUMIN 3.1*  AST 20  ALT 12  ALKPHOS 63  BILITOT 0.4  BILIDIR <0.1  IBILI NOT CALCULATED   No results found for this basename: CHOL,  in the last 72 hours  Recent Labs  04/17/13 1816  INR 1.00    Imaging: Imaging results have been reviewed  Cardiac Studies:  Assessment/Plan:   Principal Problem:   Critical lower limb ischemia- gangrene Lt 5th toe- LSFA DBA 04/18/13 Active Problems:   Ischemic toe- Lt 4 forth toe   PAD (peripheral artery disease), with Lt SFA stenosis   HTN (hypertension)   CKD stage 3, SCr normalized off Hyzaar   Dementia    PLAN:  PT consult prior to discharge. Consider low dose ACE without diuretic for  B/P.  Corine Shelter PA-C Beeper [phone] 04/19/2013, 8:02 AM   Agree with note written by Corine Shelter [facility]  S/P LSFA DB atherectomy/PTA and Stent with IDEV stent for CLI. Left foot warm and there is a strong dopplerable DP pulse and PT pulse. Labs OK. Exam benign. Groin ok. Needs PT prior to D/C. On ASA/Plavix. Home in 48-72 hours. Also needs F/U with [facility] wound care Center as well as LEAs in our office prior to [facility] with me.   Runell Gess 04/19/2013 8:15 AM

## 2013-04-19 NOTE — Progress Notes (Signed)
INITIAL NUTRITION ASSESSMENT  DOCUMENTATION CODES Per approved criteria  -Not Applicable   INTERVENTION: Add MVI daily Add Ensure Complete po BID, each supplement provides 350 kcal and 13 grams of protein. If oral intake decreases, recommend diet liberalization to Regular to promote additional PO variety. RD to continue to follow nutrition care plan.  NUTRITION DIAGNOSIS: Inadequate oral intake related to pain as evidenced by family report and dietary recall.   Goal: Intake to meet >90% of estimated nutrition needs.  Monitor:  weight trends, lab trends, I/O's, PO intake, supplement tolerance  Reason for Assessment: Malnutrition Screening Tool + MD Consult for Poor PO Intake  79 y.o. female  Admitting Dx: Critical lower limb ischemia  ASSESSMENT: PMHx significant for HTN, dementia, diverticulosis, IBS, CKD stage 3. Admitted with critical limb ischemia with ulcer on L 5th toe and ischemic. X-ray negative for osteomyelitis.  Underwent artherectomy/PTA and stent 7/14.  Family reports that pt has not been eating well for a few weeks PTA 2/2 pain and medications. Pt is now eating much better. Ate approximately 40% of breakfast and daughter at bedside reports that pt consumed approximately 90% of lunch. Daughter states that pt drinks Ensure Shakes occasionally at home when not eating well. Amenable to receiving while here. Suspect oral intake will continue to improve as pain is better managed.  Denies any issues chewing or swallowing PTA.  Height: Ht Readings from Last 1 Encounters:  04/17/13 5\' 3"  (1.6 m)    Weight: Wt Readings from Last 1 Encounters:  04/19/13 138 lb 7.2 oz (62.8 kg)    Ideal Body Weight: 115 lb  % Ideal Body Weight: 120%  Wt Readings from Last 10 Encounters:  04/19/13 138 lb 7.2 oz (62.8 kg)  04/19/13 138 lb 7.2 oz (62.8 kg)  04/12/13 141 lb (63.957 kg)  04/12/13 141 lb (63.957 kg)  04/04/13 141 lb (63.957 kg)  03/30/13 140 lb 8 oz (63.73 kg)     Usual Body Weight: 140 lb   % Usual Body Weight: 100%  BMI:  Body mass index is 24.53 kg/(m^2). WNL  Estimated Nutritional Needs: Kcal: 1300 - 1500 Protein: 56 - 69 grams Fluid: at least 1.5 liters daily  Skin:  Full thickness wound on L 5th toe (seen by WOC RN) 7/14  Diet Order: Cardiac  EDUCATION NEEDS: -No education needs identified at this time   Intake/Output Summary (Last 24 hours) at 04/19/13 1359 Last data filed at 04/19/13 0900  Gross per 24 hour  Intake   1000 ml  Output   1500 ml  Net   -500 ml    Last BM: 7/15  Labs:   Recent Labs Lab 04/17/13 1633 04/17/13 1816 04/19/13 0535  NA 137 135 136  K 3.5 3.6 3.8  CL 99 99 103  CO2 28 28 22   BUN 20 19 13   CREATININE 1.41* 1.20* 1.02  CALCIUM 9.9 9.6 8.9  GLUCOSE 107* 122* 105*    CBG (last 3)  No results found for this basename: GLUCAP,  in the last 72 hours  Scheduled Meds: . antiseptic oral rinse  15 mL Mouth Rinse BID  . aspirin EC  81 mg Oral Daily  . clopidogrel  75 mg Oral Q breakfast  . diltiazem  240 mg Oral Daily  . donepezil  2.5 mg Oral Daily  . hydrALAZINE  10 mg Intravenous UD  . lisinopril  5 mg Oral Daily    Continuous Infusions:   Past Medical History  Diagnosis Date  .  Hypertension   . Dementia     Thought to be vascular  . Glaucoma   . Diverticulosis of colon     With recurrent bouts of diverticulitis  . Irritable bowel syndrome (IBS)   . Hearing loss of aging   . Osteoporosis   . PONV (postoperative nausea and vomiting)   . Critical lower limb ischemia 04/17/2013  . PAD (peripheral artery disease), with Lt SFA stenosis 04/17/2013    LSFA PTA  . CKD (chronic kidney disease) stage 3, GFR 30-59 ml/min 04/17/2013    improved off Hyzaar    Past Surgical History  Procedure Laterality Date  . Abdominal hysterectomy    . Breast surgery  1968    removal of non cancerous lump  . Angioplasty / stenting femoral  04/18/13    LSFA    Jarold Motto MS, RD,  LDN Pager: [phone] After-hours pager: [phone]

## 2013-04-19 NOTE — Discharge Summary (Signed)
Patient ID: [name],  MRN: [id number], DOB/AGE: 1923-10-03 79 y.o.  Admit date: 04/17/2013 Discharge date: 04/19/2013  Primary Care Provider: Dr Merlyn Albert. Wilson Primary Cardiologist: Dr Herbie Baltimore  Discharge Diagnoses Principal Problem:   Critical lower limb ischemia- gangrene Lt 5th toe- LSFA DBA 04/18/13 Active Problems:   Ischemic toe- Lt 4 forth toe   PAD (peripheral artery disease), with Lt SFA stenosis   HTN (hypertension)   CKD stage 3, SCr normalized off Hyzaar   Dementia    Procedures: Diamnondback atherectomy and stenting Lt [facility] 04/18/13   Hospital Course Ms. Anfinson is an 79 year old frail-appearing female patient of Dr. Onalee Hua Harding's and Dr. Rosina Lowenstein referred for to Dr Allyson Sabal for critical limb ischemia. She has chronic renal insufficiency. She has a nonhealing ulcer over her toes. Dr Allyson Sabal angiogrammed her last week revealing high-grade moderately long segmental calcified mid and distal left SFA and popliteal artery stenosis with one vessel runoff via the peroneal artery. She was admitted 04/17/13 for hydration prior diamondback orbital rotational atherectomy. Her SCr improved off Hyzaar and with hydration. She underwent Lt SFA PTA and stenting 04/18/13. The next morning she was weak and we were concerned about her overall debilitated state. A PT consult was obtained. By the time PT evaluated the patient she was much more alert. She walked the hal without problem with her walker. She and her family want to take her home this pm. She will have lower extremity dopplers and then an office visit with Dr Allyson Sabal. We did add back an ACE without diuretic at discharge. She will need a follow up bmp when she comes for her dopplers.   Discharge Vitals:  Blood pressure 144/93, pulse 94, temperature 98.5 F (36.9 C), temperature source Oral, resp. rate 18, height 5\' 3"  (1.6 m), weight 138 lb 7.2 oz (62.8 kg), SpO2 94.00%.    Labs: Results for orders placed during the hospital encounter  of 04/17/13 (from the past 48 hour(s))  CBC WITH DIFFERENTIAL     Status: Abnormal   Collection Time    04/17/13  4:33 PM      Result Value Range   WBC 7.6  4.0 - 10.5 K/uL   RBC 3.53 (*) 3.87 - 5.11 MIL/uL   Hemoglobin 11.1 (*) 12.0 - 15.0 g/dL   HCT 40.9 (*) 81.1 - 91.4 %   MCV 93.8  78.0 - 100.0 fL   MCH 31.4  26.0 - 34.0 pg   MCHC 33.5  30.0 - 36.0 g/dL   RDW 78.2  95.6 - 21.3 %   Platelets 251  150 - 400 K/uL   Neutrophils Relative % 52  43 - 77 %   Neutro Abs 3.9  1.7 - 7.7 K/uL   Lymphocytes Relative 27  12 - 46 %   Lymphs Abs 2.1  0.7 - 4.0 K/uL   Monocytes Relative 12  3 - 12 %   Monocytes Absolute 0.9  0.1 - 1.0 K/uL   Eosinophils Relative 8 (*) 0 - 5 %   Eosinophils Absolute 0.6  0.0 - 0.7 K/uL   Basophils Relative 1  0 - 1 %   Basophils Absolute 0.1  0.0 - 0.1 K/uL  BASIC METABOLIC PANEL     Status: Abnormal   Collection Time    04/17/13  4:33 PM      Result Value Range   Sodium 137  135 - 145 mEq/L   Potassium 3.5  3.5 - 5.1 mEq/L   Chloride 99  96 -  112 mEq/L   CO2 28  19 - 32 mEq/L   Glucose, Bld 107 (*) 70 - 99 mg/dL   BUN 20  6 - 23 mg/dL   Creatinine, Ser 9.81 (*) 0.50 - 1.10 mg/dL   Calcium 9.9  8.4 - 19.1 mg/dL   GFR calc non Af Amer 32 (*) >90 mL/min   GFR calc Af Amer 37 (*) >90 mL/min   Comment:            The eGFR has been calculated     using the CKD EPI equation.     This calculation has not been     validated in all clinical     situations.     eGFR's persistently     <90 mL/min signify     possible Chronic Kidney Disease.  CBC     Status: Abnormal   Collection Time    04/17/13  6:16 PM      Result Value Range   WBC 8.4  4.0 - 10.5 K/uL   RBC 3.80 (*) 3.87 - 5.11 MIL/uL   Hemoglobin 12.0  12.0 - 15.0 g/dL   HCT 47.8 (*) 29.5 - 62.1 %   MCV 93.7  78.0 - 100.0 fL   MCH 31.6  26.0 - 34.0 pg   MCHC 33.7  30.0 - 36.0 g/dL   RDW 30.8  65.7 - 84.6 %   Platelets 248  150 - 400 K/uL  BASIC METABOLIC PANEL     Status: Abnormal   Collection  Time    04/17/13  6:16 PM      Result Value Range   Sodium 135  135 - 145 mEq/L   Potassium 3.6  3.5 - 5.1 mEq/L   Chloride 99  96 - 112 mEq/L   CO2 28  19 - 32 mEq/L   Glucose, Bld 122 (*) 70 - 99 mg/dL   BUN 19  6 - 23 mg/dL   Creatinine, Ser 9.62 (*) 0.50 - 1.10 mg/dL   Calcium 9.6  8.4 - 95.2 mg/dL   GFR calc non Af Amer 39 (*) >90 mL/min   GFR calc Af Amer 45 (*) >90 mL/min   Comment:            The eGFR has been calculated     using the CKD EPI equation.     This calculation has not been     validated in all clinical     situations.     eGFR's persistently     <90 mL/min signify     possible Chronic Kidney Disease.  PROTIME-INR     Status: None   Collection Time    04/17/13  6:16 PM      Result Value Range   Prothrombin Time 13.0  11.6 - 15.2 seconds   INR 1.00  0.00 - 1.49  HEPATIC FUNCTION PANEL     Status: Abnormal   Collection Time    04/18/13  6:24 AM      Result Value Range   Total Protein 7.0  6.0 - 8.3 g/dL   Albumin 3.1 (*) 3.5 - 5.2 g/dL   AST 20  0 - 37 U/L   ALT 12  0 - 35 U/L   Alkaline Phosphatase 63  39 - 117 U/L   Total Bilirubin 0.4  0.3 - 1.2 mg/dL   Bilirubin, Direct <8.4  0.0 - 0.3 mg/dL   Indirect Bilirubin NOT CALCULATED  0.3 - 0.9 mg/dL  POCT ACTIVATED  CLOTTING TIME     Status: None   Collection Time    04/18/13 11:52 AM      Result Value Range   Activated Clotting Time 217    POCT ACTIVATED CLOTTING TIME     Status: None   Collection Time    04/18/13 12:19 PM      Result Value Range   Activated Clotting Time 227    POCT ACTIVATED CLOTTING TIME     Status: None   Collection Time    04/18/13  2:27 PM      Result Value Range   Activated Clotting Time 176    CBC     Status: Abnormal   Collection Time    04/19/13  5:35 AM      Result Value Range   WBC 10.7 (*) 4.0 - 10.5 K/uL   RBC 3.59 (*) 3.87 - 5.11 MIL/uL   Hemoglobin 11.2 (*) 12.0 - 15.0 g/dL   HCT 16.1 (*) 09.6 - 04.5 %   MCV 92.8  78.0 - 100.0 fL   MCH 31.2  26.0 - 34.0  pg   MCHC 33.6  30.0 - 36.0 g/dL   RDW 40.9  81.1 - 91.4 %   Platelets 230  150 - 400 K/uL  BASIC METABOLIC PANEL     Status: Abnormal   Collection Time    04/19/13  5:35 AM      Result Value Range   Sodium 136  135 - 145 mEq/L   Potassium 3.8  3.5 - 5.1 mEq/L   Chloride 103  96 - 112 mEq/L   CO2 22  19 - 32 mEq/L   Glucose, Bld 105 (*) 70 - 99 mg/dL   BUN 13  6 - 23 mg/dL   Creatinine, Ser 7.82  0.50 - 1.10 mg/dL   Calcium 8.9  8.4 - 95.6 mg/dL   GFR calc non Af Amer 48 (*) >90 mL/min   GFR calc Af Amer 55 (*) >90 mL/min   Comment:            The eGFR has been calculated     using the CKD EPI equation.     This calculation has not been     validated in all clinical     situations.     eGFR's persistently     <90 mL/min signify     possible Chronic Kidney Disease.    Disposition:      Follow-up Information   Follow up with Runell Gess, MD On 05/18/2013. (10:00 am )    Contact information:   [address] [place] [address] [phone]       Discharge Medications:    Medication List    STOP taking these medications       losartan-hydrochlorothiazide 100-12.5 MG per tablet  Commonly known as:  HYZAAR      TAKE these medications       acetaminophen 325 MG tablet  Commonly known as:  TYLENOL  Take 2 tablets (650 mg total) by mouth every 4 (four) hours as needed.     aspirin 81 MG tablet  Take 81 mg by mouth daily.     clopidogrel 75 MG tablet  Commonly known as:  PLAVIX  Take 1 tablet (75 mg total) by mouth daily with breakfast.     diltiazem 240 MG 24 hr capsule  Commonly known as:  CARDIZEM CD  Take 240 mg by mouth daily.     donepezil 5  MG tablet  Commonly known as:  ARICEPT  Take 2.5 mg by mouth every morning.     feeding supplement Liqd  Take 237 mLs by mouth 2 (two) times daily between meals.     HYDROcodone-acetaminophen 5-325 MG per tablet  Commonly known as:  NORCO/VICODIN  Take 1 tablet by mouth every 6 (six)  hours as needed for pain.     lisinopril 5 MG tablet  Commonly known as:  PRINIVIL,ZESTRIL  Take 1 tablet (5 mg total) by mouth daily.     SYSTANE OP  Place 1 drop into both eyes as needed (dry eyes).     VITAMIN B 12 PO  Take 1,000 mg by mouth daily.         Duration of Discharge Encounter: Greater than 30 minutes including physician time.  Jolene Provost PA-C 04/19/2013 4:21 PM

## 2013-04-19 NOTE — Evaluation (Signed)
Physical Therapy Evaluation Patient Details Name: [name] MRN: [id number] DOB: 1923/02/28 Today's Date: 04/19/2013 Time: 4098-1191 PT Time Calculation (min): 26 min  PT Assessment / Plan / Recommendation History of Present Illness  Pt adm for ischemic leg with Lt toe necrosis and underwent atherectomy, PTA and stenting.  Clinical Impression  Patient evaluated by Physical Therapy with no further acute PT needs identified. All education has been completed and the patient/family has no further questions. Pt plans to go home for a short period of time (with spouse and daughter's assist) and then her and husband will be moving in with their daughter (in [place]). See below for any follow-up Physial Therapy or equipment needs. PT is signing off. Thank you for this referral.      PT Assessment  Patent does not need any further PT services    Follow Up Recommendations  No PT follow up;Supervision/Assistance - 24 hour             Equipment Recommendations  3in1 (PT)    Recommendations for Other Services     Frequency      Precautions / Restrictions Precautions Precautions: Fall Precaution Comments: no recent falls Restrictions Weight Bearing Restrictions: No Other Position/Activity Restrictions: pt self-selects to walk on her Lt heel which protects Lt toes from pressure. Her family has modified a slipper she can wear at home. do not feel a post-op shoe would be beneficial as it could make her balance worse.   Pertinent Vitals/Pain "much less" since procedure; pt unable to rate due to dementia      Mobility  Bed Mobility Bed Mobility: Supine to Sit;Sit to Supine Supine to Sit: 6: Modified independent (Device/Increase time);HOB flat Sit to Supine: 6: Modified independent (Device/Increase time);HOB flat Details for Bed Mobility Assistance: incr time Transfers Transfers: Sit to Stand;Stand to Sit Sit to Stand: 4: Min guard;With upper extremity assist Stand to Sit: 4: Min  guard;With upper extremity assist Details for Transfer Assistance: vc for safe use of RW (tries to pull up on RW) Ambulation/Gait Ambulation/Gait Assistance: 4: Min guard Ambulation Distance (Feet): 35 Feet Assistive device: Rolling walker Ambulation/Gait Assistance Details: Pt walks on Lt heel with toes off the floor due to pain. Tends to push RW too far ahead of her, able to correct and maintain with cuing x 2. Gait Pattern: Step-to pattern;Decreased stride length;Decreased weight shift to left Stairs: No (Pt, spouse, dtr deferred as report no problem)    Exercises General Exercises - Lower Extremity Ankle Circles/Pumps: AROM;5 reps;Both;Seated   PT Diagnosis:    PT Problem List:   PT Treatment Interventions:       PT Goals(Current goals can be found in the care plan section) Acute Rehab PT Goals Patient Stated Goal: less pain and walking better  Visit Information  Last PT Received On: 04/19/13 Assistance Needed: +1 History of Present Illness: Pt adm for ischemic leg with Lt toe necrosis and underwent atherectomy, PTA and stenting.       Prior Functioning  Home Living Family/patient expects to be discharged to:: Private residence Living Arrangements: Spouse/significant other Available Help at Discharge: Family;Available 24 hours/day Type of Home: House Home Access: Stairs to enter [employer] of Steps: 2 Entrance Stairs-Rails: Right Home Layout: Two level;Able to live on main level with bedroom/bathroom Home Equipment: Dan Humphreys - 2 wheels;Cane - single point;Shower seat;Grab bars - tub/shower;Hand held shower head (BSC that was loaned to pt) Additional Comments: Pt uses borrowed BSC in bedroom for nightime voiding; during the day  she goes into the bathroom and has difficulty getting up from the low toilet. She pulls on RW (unsafe) Prior Function Level of Independence: Needs assistance Gait / Transfers Assistance Needed: supervision to occasional assist depending  on how much her foot was hurthing ADL's / Homemaking Assistance Needed: does sponge bath unless one of her daughters is there to help her shower Communication Communication: No difficulties    Cognition  Cognition Arousal/Alertness: Awake/alert Behavior During Therapy: WFL for tasks assessed/performed Overall Cognitive Status: History of cognitive impairments - at baseline Memory: Decreased short-term memory    Extremity/Trunk Assessment Upper Extremity Assessment Upper Extremity Assessment: Generalized weakness Lower Extremity Assessment Lower Extremity Assessment: Generalized weakness Cervical / Trunk Assessment Cervical / Trunk Assessment: Kyphotic   Balance Balance Balance Assessed: Yes Static Standing Balance Static Standing - Balance Support: Left upper extremity supported Static Standing - Level of Assistance: 4: Min assist Dynamic Standing Balance Dynamic Standing - Balance Support: Bilateral upper extremity supported Dynamic Standing - Level of Assistance: 4: Min assist  End of Session PT - End of Session Equipment Utilized During Treatment: Gait belt Activity Tolerance: Patient tolerated treatment well Patient left: in bed;with call bell/phone within reach;with family/visitor present Nurse Communication: Mobility status;Other (comment) (OK for d/c; needs BSC for home)  GP Functional Assessment Tool Used: clinical judgement Functional Limitation: Mobility: Walking and moving around Mobility: Walking and Moving Around Current Status ([id number]): At least 1 percent but less than 20 percent impaired, limited or restricted Mobility: Walking and Moving Around Goal Status [phone]): At least 1 percent but less than 20 percent impaired, limited or restricted Mobility: Walking and Moving Around Discharge Status [phone]): At least 1 percent but less than 20 percent impaired, limited or restricted   Byrl Latin 04/19/2013, 3:09 PM Pager [phone]

## 2013-04-21 ENCOUNTER — Encounter (HOSPITAL_BASED_OUTPATIENT_CLINIC_OR_DEPARTMENT_OTHER): Payer: Medicare Other | Attending: Internal Medicine

## 2013-04-21 DIAGNOSIS — M810 Age-related osteoporosis without current pathological fracture: Secondary | ICD-10-CM | POA: Insufficient documentation

## 2013-04-21 DIAGNOSIS — L97509 Non-pressure chronic ulcer of other part of unspecified foot with unspecified severity: Secondary | ICD-10-CM | POA: Insufficient documentation

## 2013-04-21 DIAGNOSIS — I739 Peripheral vascular disease, unspecified: Secondary | ICD-10-CM | POA: Insufficient documentation

## 2013-04-21 DIAGNOSIS — Z7982 Long term (current) use of aspirin: Secondary | ICD-10-CM | POA: Insufficient documentation

## 2013-04-21 DIAGNOSIS — N183 Chronic kidney disease, stage 3 unspecified: Secondary | ICD-10-CM | POA: Insufficient documentation

## 2013-04-21 DIAGNOSIS — Z79899 Other long term (current) drug therapy: Secondary | ICD-10-CM | POA: Insufficient documentation

## 2013-04-21 DIAGNOSIS — I129 Hypertensive chronic kidney disease with stage 1 through stage 4 chronic kidney disease, or unspecified chronic kidney disease: Secondary | ICD-10-CM | POA: Insufficient documentation

## 2013-04-21 DIAGNOSIS — F0390 Unspecified dementia without behavioral disturbance: Secondary | ICD-10-CM | POA: Insufficient documentation

## 2013-04-22 NOTE — Progress Notes (Signed)
[facility]  NAME:  [name], [name]             ACCOUNT NO.:  [id number]  MEDICAL RECORD NO.:  [id number]      DATE OF BIRTH:  1923-08-14  PHYSICIAN:  Maxwell Caul, M.D. VISIT DATE:  04/21/2013                                  OFFICE VISIT   LOCATION:  [facility].  [name] is an 79 year old woman referred through the courtesy of [facility], Dr. Allyson Sabal.  She is a patient who was initially referred to him through Dr. Trey Sailors office for ischemia of her left fifth toe.  An x-ray of the foot did not show any evidence of osteomyelitis.  Arterial Dopplers were done, which showed a left ABI in the 0.6 range.  An angiogram was done, which showed normal iliacs, but moderately long segment 70-80% calcified stenosis in the left SFA with a focal 80% calcified stenosis in the left popliteal artery.  She was admitted to hospital for atherectomy plus or minus PTA and stenting. She required some hydration.  Her creatinine improved to 1.2 from over 2.  Since the procedure, her family states that her pain has improved markedly.  She is here for our review of the area involving her left fifth toe.  PAST MEDICAL HISTORY:  Includes hypertension, dementia, glaucoma, diverticulosis, irritable bowel syndrome, hearing loss, osteoporosis, PAD, chronic renal failure stage III.  PAST SURGICAL HISTORY:  Abdominal hysterectomy, breast lumpectomy, and cataract surgery.  MEDICATION LIST:  Reviewed.  She is on ASA 81 daily, vitamin B12 1000 daily, diltiazem 240 daily, Aricept 2.5 daily, hydrocodone/acetaminophen q.6 p.r.n., Hyzaar 100/12.5 daily, and Systane p.r.n. ophthalmic.  PHYSICAL EXAMINATION:  VITAL SIGNS:  The patient's temperature was 97.7, pulse 71, respirations 16, blood pressure 118/69. RESPIRATORY:  She has a significant kyphosis, but air entry was equal bilaterally. CARDIAC:  Heart sounds are normal.  There is no murmurs,  no signs of heart failure. EXTREMITIES:  Her dorsalis pedis pulse was palpable on the left.  ABI calculated in this clinic was 1.09, much better than previously reported.  The area in question is over her left fifth toe.  There is a cap of dry ischemic eschar over the top of this; however, the major issue is at the base of the left fifth toe and the fourth-fifth toe interspace.  This was a very painful area, there is a small ulcer in this area.  IMPRESSION: 1. Ischemic wound of the left fifth toe.  No debridement was done     here.  The major area of concern is at the base of the fifth toe     and the fourth-fifth toe webspace.  We applied silver alginate in     this area, also a combination of     hydrogel and Santyl to the ischemic area of the wound.  The whole     area of the toe and the dressings were wrapped in gauze.  A light     Kerlix and a net were applied.  This dressing can be left intact     for the entire week and we will look at this again in 1 week's     time.          ______________________________ Maxwell Caul, M.D.  MGR/MEDQ  D:  04/21/2013  T:  04/22/2013  Job:  [id number]

## 2013-05-18 ENCOUNTER — Ambulatory Visit: Payer: Medicare Other | Admitting: Cardiovascular Disease

## 2014-09-14 ENCOUNTER — Encounter (HOSPITAL_COMMUNITY): Payer: Self-pay | Admitting: Cardiovascular Disease

## 2016-07-10 LAB — LIPID PANEL
CHOLESTEROL: 177 mg/dL (ref 0–200)
HDL: 50 mg/dL (ref 35–70)
LDL Cholesterol: 93 mg/dL
TRIGLYCERIDES: 172 mg/dL — AB (ref 40–160)

## 2017-01-08 LAB — CBC AND DIFFERENTIAL
HCT: 34 % — AB (ref 36–46)
HEMOGLOBIN: 11.5 g/dL — AB (ref 12.0–16.0)
PLATELETS: 218 10*3/uL (ref 150–399)
WBC: 17.2 10^3/mL

## 2017-01-08 LAB — BASIC METABOLIC PANEL
BUN: 14 mg/dL (ref 4–21)
CREATININE: 1.1 mg/dL (ref 0.5–1.1)
Glucose: 109 mg/dL
POTASSIUM: 4.7 mmol/L (ref 3.4–5.3)
Sodium: 141 mmol/L (ref 137–147)

## 2017-01-13 LAB — BASIC METABOLIC PANEL
BUN: 14 mg/dL (ref 4–21)
Creatinine: 1.4 mg/dL — AB (ref 0.5–1.1)
GLUCOSE: 116 mg/dL
Potassium: 4.7 mmol/L (ref 3.4–5.3)
SODIUM: 144 mmol/L (ref 137–147)

## 2017-01-18 LAB — HEPATIC FUNCTION PANEL
ALK PHOS: 54 U/L (ref 25–125)
ALT: 11 U/L (ref 7–35)
AST: 16 U/L (ref 13–35)
Bilirubin, Total: 0.3 mg/dL

## 2017-01-18 LAB — CBC AND DIFFERENTIAL
HCT: 29 % — AB (ref 36–46)
Hemoglobin: 9 g/dL — AB (ref 12.0–16.0)
PLATELETS: 303 10*3/uL (ref 150–399)
WBC: 13.4 10^3/mL

## 2017-01-18 LAB — BASIC METABOLIC PANEL
BUN: 13 mg/dL (ref 4–21)
CREATININE: 1 mg/dL (ref 0.5–1.1)
Glucose: 106 mg/dL
POTASSIUM: 4.3 mmol/L (ref 3.4–5.3)
Sodium: 142 mmol/L (ref 137–147)

## 2017-01-19 LAB — HEPATIC FUNCTION PANEL
ALK PHOS: 58 U/L (ref 25–125)
ALT: 11 U/L (ref 7–35)
AST: 20 U/L (ref 13–35)
Bilirubin, Total: 0.2 mg/dL

## 2017-01-19 LAB — CBC AND DIFFERENTIAL
HCT: 29 % — AB (ref 36–46)
HEMOGLOBIN: 9.1 g/dL — AB (ref 12.0–16.0)
Platelets: 310 10*3/uL (ref 150–399)
WBC: 10.9 10^3/mL

## 2017-01-19 LAB — BASIC METABOLIC PANEL
BUN: 14 mg/dL (ref 4–21)
Creatinine: 1 mg/dL (ref 0.5–1.1)
GLUCOSE: 101 mg/dL
POTASSIUM: 4.2 mmol/L (ref 3.4–5.3)
SODIUM: 144 mmol/L (ref 137–147)

## 2017-01-20 LAB — BASIC METABOLIC PANEL
BUN: 15 mg/dL (ref 4–21)
Creatinine: 0.9 mg/dL (ref 0.5–1.1)
Glucose: 110 mg/dL
Potassium: 3.9 mmol/L (ref 3.4–5.3)
SODIUM: 145 mmol/L (ref 137–147)

## 2017-01-20 LAB — CBC AND DIFFERENTIAL
HCT: 28 % — AB (ref 36–46)
Hemoglobin: 8.9 g/dL — AB (ref 12.0–16.0)
PLATELETS: 344 10*3/uL (ref 150–399)
WBC: 11 10^3/mL

## 2017-01-20 LAB — HEPATIC FUNCTION PANEL
ALK PHOS: 58 U/L (ref 25–125)
ALT: 11 U/L (ref 7–35)
AST: 17 U/L (ref 13–35)
Bilirubin, Total: 0.2 mg/dL

## 2017-01-21 LAB — BASIC METABOLIC PANEL
BUN: 17 mg/dL (ref 4–21)
CREATININE: 1 mg/dL (ref 0.5–1.1)
GLUCOSE: 100 mg/dL
POTASSIUM: 3.5 mmol/L (ref 3.4–5.3)
SODIUM: 145 mmol/L (ref 137–147)

## 2017-01-21 LAB — HEPATIC FUNCTION PANEL
ALT: 13 U/L (ref 7–35)
AST: 23 U/L (ref 13–35)
Alkaline Phosphatase: 59 U/L (ref 25–125)
Bilirubin, Total: 0.2 mg/dL

## 2017-01-21 LAB — CBC AND DIFFERENTIAL
HCT: 30 % — AB (ref 36–46)
Hemoglobin: 9.4 g/dL — AB (ref 12.0–16.0)
Platelets: 384 10*3/uL (ref 150–399)
WBC: 11.5 10^3/mL

## 2017-01-22 LAB — CBC AND DIFFERENTIAL
HCT: 27 % — AB (ref 36–46)
Hemoglobin: 8.5 g/dL — AB (ref 12.0–16.0)
PLATELETS: 338 10*3/uL (ref 150–399)
WBC: 9.8 10*3/mL

## 2017-01-23 LAB — CBC AND DIFFERENTIAL
HEMATOCRIT: 29 % — AB (ref 36–46)
HEMOGLOBIN: 9 g/dL — AB (ref 12.0–16.0)
PLATELETS: 341 10*3/uL (ref 150–399)
WBC: 9.5 10^3/mL

## 2017-01-24 LAB — BASIC METABOLIC PANEL
BUN: 17 mg/dL (ref 4–21)
Creatinine: 1 mg/dL (ref 0.5–1.1)
POTASSIUM: 3.5 mmol/L (ref 3.4–5.3)
SODIUM: 145 mmol/L (ref 137–147)

## 2017-01-24 LAB — HEPATIC FUNCTION PANEL
ALK PHOS: 59 U/L (ref 25–125)
ALT: 13 U/L (ref 7–35)
AST: 23 U/L (ref 13–35)
Bilirubin, Total: 0.2 mg/dL

## 2017-01-24 LAB — CBC AND DIFFERENTIAL
HCT: 34 % — AB (ref 36–46)
Hemoglobin: 10.5 g/dL — AB (ref 12.0–16.0)
Platelets: 296 10*3/uL (ref 150–399)
WBC: 7.2 10^3/mL

## 2017-01-26 ENCOUNTER — Non-Acute Institutional Stay (SKILLED_NURSING_FACILITY): Payer: Medicare Other | Admitting: Internal Medicine

## 2017-01-26 DIAGNOSIS — B3749 Other urogenital candidiasis: Secondary | ICD-10-CM

## 2017-01-26 DIAGNOSIS — I739 Peripheral vascular disease, unspecified: Secondary | ICD-10-CM

## 2017-01-26 DIAGNOSIS — J9601 Acute respiratory failure with hypoxia: Secondary | ICD-10-CM | POA: Diagnosis not present

## 2017-01-26 DIAGNOSIS — F02818 Dementia in other diseases classified elsewhere, unspecified severity, with other behavioral disturbance: Secondary | ICD-10-CM

## 2017-01-26 DIAGNOSIS — I110 Hypertensive heart disease with heart failure: Secondary | ICD-10-CM | POA: Diagnosis not present

## 2017-01-26 DIAGNOSIS — I4891 Unspecified atrial fibrillation: Secondary | ICD-10-CM | POA: Diagnosis not present

## 2017-01-26 DIAGNOSIS — G301 Alzheimer's disease with late onset: Secondary | ICD-10-CM

## 2017-01-26 DIAGNOSIS — F0281 Dementia in other diseases classified elsewhere with behavioral disturbance: Secondary | ICD-10-CM

## 2017-01-26 DIAGNOSIS — K810 Acute cholecystitis: Secondary | ICD-10-CM | POA: Diagnosis not present

## 2017-01-27 ENCOUNTER — Encounter: Payer: Self-pay | Admitting: Internal Medicine

## 2017-01-27 DIAGNOSIS — B3749 Other urogenital candidiasis: Secondary | ICD-10-CM | POA: Insufficient documentation

## 2017-01-27 DIAGNOSIS — J9601 Acute respiratory failure with hypoxia: Secondary | ICD-10-CM | POA: Insufficient documentation

## 2017-01-27 DIAGNOSIS — K810 Acute cholecystitis: Secondary | ICD-10-CM | POA: Insufficient documentation

## 2017-01-27 DIAGNOSIS — I509 Heart failure, unspecified: Secondary | ICD-10-CM | POA: Insufficient documentation

## 2017-01-27 DIAGNOSIS — I4891 Unspecified atrial fibrillation: Secondary | ICD-10-CM | POA: Insufficient documentation

## 2017-01-27 DIAGNOSIS — I739 Peripheral vascular disease, unspecified: Secondary | ICD-10-CM | POA: Insufficient documentation

## 2017-01-27 NOTE — Progress Notes (Signed)
: Provider:  Randon Goldsmith. [name], MD Location:  [facility] Room Number: [phone] Place of Service:  SNF [phone])  PCP: Pamelia Hoit, MD Patient Care Team: Barbie Banner, MD as PCP - General (Family Medicine)  Extended Emergency Contact Information Primary Emergency Contact: Norwood Levo [place] Home Phone: [phone] Relation: Daughter Secondary Emergency Contact: Sliger,[name] Address: [address]          [place], [place] [address] [place] Home Phone: [phone] Relation: Spouse     Allergies: Codeine; Demerol [meperidine]; Benzocaine; Darvon [propoxyphene]; Penicillins; Phenobarbital; and Sulfa antibiotics  Chief Complaint  Patient presents with  . New Admit To SNF    Admit to Facility    HPI: Patient is 83 y.o. female with Hypertension, congestive heart failure, dementia, who presented to [facility] emergency Department via EMS with complaint of right lower quadrant abdominal pain for 2 days. Pain is a 6-7 out of 10 not radiating and not associated with nausea vomiting or diarrhea. In the ED white blood cell count was 28.7, lactic acid 2.5, probe BNP 2039, urine with 45 WBC. CT of the abdomen pelvis showed acute cholecystitis. Patient was admitted to [facility] from March 29 through April 21 where she went underwent a percutaneous cholecystostomy tube placement by interventional radiology on 3/30 and  was covered with broad-spectrum antibiotics i.e. Zosyn. She was noted to have no significant improvement and continued to have poor by mouth intake. Repeat CT on 4/4 showed gallbladder remained distended with wall thickening. At that time general surgery decided to go to laparoscopic cholecystectomy which was performed on 01/22/2017. She improved after surgery. The hospital course was further, complicated by a fungal UTI treated with a seven-day course of Diflucan and atrial fib with RVR for which  patient was started on metoprolol and Cardizem. Patient also developed acute respiratory failure with hypoxia and required O2 via nasal cannula. No findings of congestive heart failure or pneumonia. Patient is admitted to skilled nursing facility with generalized weakness for OT/PT. While at skilled nursing facility patient will be followed for hypertension treated with triamterene and hydrochlorothiazide, dementia treated with Aricept and peripheral vascular disease treated with aspirin and Plavix.  Past Medical History:  Diagnosis Date  . CHF (congestive heart failure) (HCC)   . CKD (chronic kidney disease) stage 3, GFR 30-59 ml/min 04/17/2013   improved off Hyzaar  . Critical lower limb ischemia 04/17/2013  . Dementia    Thought to be vascular  . Diverticulosis of colon    With recurrent bouts of diverticulitis  . Glaucoma   . Hearing loss of aging   . Hypertension   . Irritable bowel syndrome (IBS)   . Osteoporosis   . PAD (peripheral artery disease), with Lt SFA stenosis 04/17/2013   LSFA PTA  . PONV (postoperative nausea and vomiting)     Past Surgical History:  Procedure Laterality Date  . ABDOMINAL HYSTERECTOMY    . ANGIOPLASTY / STENTING FEMORAL  04/18/13   LSFA  . BREAST SURGERY  1968   removal of non cancerous lump  . LOWER EXTREMITY ANGIOGRAM Bilateral 04/12/2013   Procedure: LOWER EXTREMITY ANGIOGRAM;  Surgeon: Runell Gess, MD;  Location: [facility] CATH LAB;  Service: Cardiovascular;  Laterality: Bilateral;  Limited bilateral  . LOWER EXTREMITY ANGIOGRAM Bilateral 04/18/2013   Procedure: LOWER EXTREMITY ANGIOGRAM;  Surgeon: Runell Gess, MD;  Location: [facility] CATH LAB;  Service: Cardiovascular;  Laterality: Bilateral;  Allergies as of 01/26/2017      Reactions   Codeine Itching, Other (See Comments)   hyper   Demerol [meperidine] Nausea Only   Benzocaine Rash   Darvon [propoxyphene] Rash   Penicillins Itching, Rash   Phenobarbital Itching, Rash   Sulfa Antibiotics  Itching, Rash      Medication List       Accurate as of 01/26/17 11:59 PM. Always use your most recent med list.          Acidophilus Lactobacillus Caps Take 1 capsule by mouth 3 (three) times daily with meals.   albuterol 108 (90 Base) MCG/ACT inhaler Commonly known as:  PROVENTIL HFA;VENTOLIN HFA Inhale 2 puffs into the lungs 3 (three) times daily as needed for wheezing.   aspirin EC 81 MG tablet Take 81 mg by mouth daily before breakfast.   clopidogrel 75 MG tablet Commonly known as:  PLAVIX Take 75 mg by mouth daily before breakfast.   cyanocobalamin 1000 MCG tablet Take 1,000 mcg by mouth every morning.   diltiazem 240 MG 24 hr capsule Commonly known as:  TIAZAC Take 240 mg by mouth daily.   divalproex 125 MG capsule Commonly known as:  DEPAKOTE SPRINKLE Take by mouth 2 (two) times daily.   donepezil 10 MG tablet Commonly known as:  ARICEPT Take 10 mg by mouth at bedtime.   EQL VITAMIN D3 2000 units Caps Generic drug:  Cholecalciferol Take 1 capsule by mouth daily before breakfast.   ipratropium 0.03 % nasal spray Commonly known as:  ATROVENT Place 2 sprays into both nostrils 2 (two) times daily.   loratadine 10 MG tablet Commonly known as:  CLARITIN Take 10 mg by mouth daily as needed for allergies.   megestrol 400 MG/10ML suspension Commonly known as:  MEGACE Take 200 mg by mouth daily. 5 ml = 200 mg   Melatonin 5 MG Tabs Take 5 mg by mouth at bedtime.   Metoprolol Tartrate 75 MG Tabs Take 75 mg by mouth 2 (two) times daily.   nystatin powder Commonly known as:  MYCOSTATIN/NYSTOP Apply 1 application topically 2 times daily for 14 days   senna-docusate 8.6-50 MG tablet Commonly known as:  Senokot-S Take 1 tablet by mouth at bedtime.   triamterene-hydrochlorothiazide 37.5-25 MG tablet Commonly known as:  MAXZIDE-25 Take 1 tablet by mouth every morning.       Meds ordered this encounter  Medications  . diltiazem (TIAZAC) 240 MG 24 hr  capsule    Sig: Take 240 mg by mouth daily.  . Acidophilus Lactobacillus CAPS    Sig: Take 1 capsule by mouth 3 (three) times daily with meals.  . megestrol (MEGACE) 400 MG/10ML suspension    Sig: Take 200 mg by mouth daily. 5 ml = 200 mg  . Metoprolol Tartrate 75 MG TABS    Sig: Take 75 mg by mouth 2 (two) times daily.  Marland Kitchen nystatin (MYCOSTATIN/NYSTOP) powder    Sig: Apply 1 application topically 2 times daily for 14 days  . aspirin EC 81 MG tablet    Sig: Take 81 mg by mouth daily before breakfast.  . albuterol (PROVENTIL HFA;VENTOLIN HFA) 108 (90 Base) MCG/ACT inhaler    Sig: Inhale 2 puffs into the lungs 3 (three) times daily as needed for wheezing.  . clopidogrel (PLAVIX) 75 MG tablet    Sig: Take 75 mg by mouth daily before breakfast.  . cyanocobalamin 1000 MCG tablet    Sig: Take 1,000 mcg by mouth every morning.  Marland Kitchen  Cholecalciferol (EQL VITAMIN D3) 2000 units CAPS    Sig: Take 1 capsule by mouth daily before breakfast.  . divalproex (DEPAKOTE SPRINKLE) 125 MG capsule    Sig: Take by mouth 2 (two) times daily.  Marland Kitchen donepezil (ARICEPT) 10 MG tablet    Sig: Take 10 mg by mouth at bedtime.  Marland Kitchen ipratropium (ATROVENT) 0.03 % nasal spray    Sig: Place 2 sprays into both nostrils 2 (two) times daily.  Marland Kitchen loratadine (CLARITIN) 10 MG tablet    Sig: Take 10 mg by mouth daily as needed for allergies.  . Melatonin 5 MG TABS    Sig: Take 5 mg by mouth at bedtime.  . senna-docusate (SENOKOT-S) 8.6-50 MG tablet    Sig: Take 1 tablet by mouth at bedtime.  . triamterene-hydrochlorothiazide (MAXZIDE-25) 37.5-25 MG tablet    Sig: Take 1 tablet by mouth every morning.    Immunization History  Administered Date(s) Administered  . Influenza Whole 07/07/2015  . Influenza, High Dose Seasonal PF 07/10/2016  . Influenza-Unspecified 09/11/2014  . PPD Test 08/11/2016  . Pneumococcal Conjugate-13 12/05/2013  . Pneumococcal Polysaccharide-23 07/10/2016  . Tdap 09/05/2013    Social History    Substance Use Topics  . Smoking status: Never Smoker  . Smokeless tobacco: Never Used  . Alcohol use No    Family history is   Family History  Problem Relation Age of Onset  . Stomach cancer Mother   . Hypertension Mother   . Heart attack Father   . Hypertension Father   . Allergies Neg Hx   . Anesthesia problems Neg Hx   . Cancer Neg Hx   . Clotting disorder Neg Hx   . Diabetes Neg Hx   . Asthma Neg Hx   . Hearing loss Neg Hx   . Heart disease Neg Hx   . Neurologic Disorder Neg Hx   . Thyroid disease Neg Hx       Review of Systems  DATA OBTAINED: from patient, nurse GENERAL:  no fevers, fatigue, appetite changes SKIN: No itching, or rash EYES: No eye pain, redness, discharge EARS: No earache, tinnitus, change in hearing NOSE: No congestion, drainage or bleeding  MOUTH/THROAT: No mouth or tooth pain, No sore throat RESPIRATORY: No cough, wheezing, SOB CARDIAC: No chest pain, palpitations, lower extremity edema  GI: No abdominal pain, No N/V/D or constipation, No heartburn or reflux  GU: No dysuria, frequency or urgency, or incontinence  MUSCULOSKELETAL: No unrelieved bone/joint pain NEUROLOGIC: No headache, dizziness or focal weakness PSYCHIATRIC: No c/o anxiety or sadness   Vitals:   01/26/17 1053  BP: (!) 176/93  Pulse: 85  Resp: (!) 24  Temp: 97.5 F (36.4 C)    SpO2 Readings from Last 1 Encounters:  01/26/17 97%   Body mass index is 26.57 kg/m.     Physical Exam  GENERAL APPEARANCE: Alert,  No acute distress.  SKIN: No diaphoresis rash HEAD: Normocephalic, atraumatic  EYES: Conjunctiva/lids clear. Pupils round, reactive. EOMs intact.  EARS: External exam WNL, canals clear. Hearing grossly normal.  NOSE: No deformity or discharge.  MOUTH/THROAT: Lips w/o lesions  RESPIRATORY: Breathing is even, unlabored. Lung sounds are clear   CARDIOVASCULAR: Heart RRR no murmurs, rubs or gallops. No peripheral edema.   GASTROINTESTINAL: Abdomen is soft,  non-tender, not distended w/ normal bowel sounds,incision areas look ckean GENITOURINARY: Bladder non tender, not distended  MUSCULOSKELETAL: No abnormal joints or musculature NEUROLOGIC:  Cranial nerves 2-12 grossly intact. Moves all extremities  PSYCHIATRIC: Mood  and affect appropriate to situation with dementia, no behavioral issues  Patient Active Problem List   Diagnosis Date Noted  . CHF (congestive heart failure) (HCC)   . Critical lower limb ischemia- gangrene Lt 5th toe- LSFA DBA 04/18/13 04/17/2013  . PAD (peripheral artery disease), with Lt SFA stenosis 04/17/2013  . CKD stage 3, SCr normalized off Hyzaar 04/17/2013  . Ischemic toe- Lt 4 forth toe 04/04/2013  . HTN (hypertension) 04/04/2013  . Dementia 04/04/2013      Labs reviewed: Basic Metabolic Panel:    Component Value Date/Time   NA 145 01/24/2017   K 3.5 01/24/2017   CL 103 04/19/2013 0535   CO2 22 04/19/2013 0535   GLUCOSE 105 (H) 04/19/2013 0535   BUN 17 01/24/2017   CREATININE 1.0 01/24/2017   CREATININE 1.02 04/19/2013 0535   CALCIUM 8.9 04/19/2013 0535   PROT 7.0 04/18/2013 0624   ALBUMIN 3.1 (L) 04/18/2013 0624   AST 23 01/24/2017   ALT 13 01/24/2017   ALKPHOS 59 01/24/2017   BILITOT 0.4 04/18/2013 0624   GFRNONAA 48 (L) 04/19/2013 0535   GFRAA 55 (L) 04/19/2013 0535     Recent Labs  01/24/17  NA 145  K 3.5  BUN 17  CREATININE 1.0   Liver Function Tests:  Recent Labs  01/24/17  AST 23  ALT 13  ALKPHOS 59   No results for input(s): LIPASE, AMYLASE in the last 8760 hours. No results for input(s): AMMONIA in the last 8760 hours. CBC:  Recent Labs  01/24/17  WBC 7.2  HGB 10.5*  HCT 34*  PLT 296   Lipid No results for input(s): CHOL, HDL, LDLCALC, TRIG in the last 8760 hours.  Cardiac Enzymes: No results for input(s): CKTOTAL, CKMB, CKMBINDEX, TROPONINI in the last 8760 hours. BNP: No results for input(s): BNP in the last 8760 hours. No results found for: MICROALBUR No  results found for: HGBA1C No results found for: TSH No results found for: VITAMINB12 No results found for: FOLATE No results found for: IRON, TIBC, FERRITIN  Imaging and Procedures obtained prior to SNF admission: Dg Chest 2 View  Result Date: 04/18/2013 *RADIOLOGY REPORT* Clinical Data: Preoperative chest radiograph. CHEST - 2 VIEW Comparison: Chest radiograph from 10/18/2010 Findings: The lungs are well-aerated.  Mild chronic peribronchial thickening is noted.  There is no evidence of focal opacification, pleural effusion or pneumothorax. The heart is normal in size; the mediastinal contour is within normal limits.  No acute osseous abnormalities are seen.  There is mild chronic deformity involving the proximal right humerus. Relatively diffuse calcification is noted along the distal descending and proximal abdominal aorta. IMPRESSION: Mild chronic peribronchial thickening noted; no acute cardiopulmonary process seen. Original Report Authenticated By: Tonia Ghent, M.D.     Not all labs, radiology exams or other studies done during hospitalization come through on my EPIC note; however they are reviewed by me.    Assessment and Plan  ACUTE CHOLECYSTITS - underwent percutaneous cholecystostomy tube placement per IR with no improvement and subsequently underwent laparascopic cholecystectomy on 4/9 with good result. SNF - admitted for OT/PT  FUNGAL UTI- candida, tx complete with 7 days diflucan.  ACUTE RESP FAILURE WITH HYPOXIA -  BC neg, no evidence of PNA, 5 day course of cefepime, O2 2L [facility] - cont O2 and wean  AF WITH RVR - was in it for several days and was started on cardizem and metoprolol, doses titrated. On dose of metoprolol 75 mg BID and diltiazem  240 rate is 90-110, but pt also having some bradycardia and pauses  SNF - cont metoprolol 75 mg BID and diltiazem 240 mg daily; will follow rate and sx, titrate as needed;   HTN SNF - not well controlled but will monitor; cont  maxzide 37.5/25 as well as metoprolol 75 mg BID and diltiazem 240 mg daily  DEMENTIA WITH BEHAVOIRS SNF - stable; cont aricept 10 mg AND DEPAKOTE125 MG bid  PVD SNF - no reported or noted lesions; cont ASA 81 mg daily and plavix 75 mg daily   tIME SPENT > 45 MIN;> 50% of time with patient was spent reviewing records, labs, tests and studies, counseling and developing plan of care  Thurston Hole D. [name], MD

## 2017-02-09 ENCOUNTER — Non-Acute Institutional Stay (SKILLED_NURSING_FACILITY): Payer: Medicare Other | Admitting: Internal Medicine

## 2017-02-09 DIAGNOSIS — R0789 Other chest pain: Secondary | ICD-10-CM

## 2017-02-12 ENCOUNTER — Non-Acute Institutional Stay (SKILLED_NURSING_FACILITY): Payer: Medicare Other | Admitting: Internal Medicine

## 2017-02-12 ENCOUNTER — Encounter: Payer: Self-pay | Admitting: Internal Medicine

## 2017-02-12 DIAGNOSIS — R079 Chest pain, unspecified: Secondary | ICD-10-CM

## 2017-02-12 DIAGNOSIS — I4891 Unspecified atrial fibrillation: Secondary | ICD-10-CM

## 2017-02-12 LAB — PROTIME-INR: Protime: 15.3 s — AB (ref 10.0–13.8)

## 2017-02-12 LAB — POCT INR: INR: 1.2 — AB (ref 0.9–1.1)

## 2017-02-12 LAB — HEPATIC FUNCTION PANEL
ALT: 30 U/L (ref 7–35)
AST: 29 U/L (ref 13–35)
Alkaline Phosphatase: 98 U/L (ref 25–125)
Bilirubin, Total: 0.5 mg/dL

## 2017-02-12 NOTE — Progress Notes (Signed)
Location:  [facility] Room Number: [phone] Place of Service:  SNF [phone])  [name]. Julie Hollingshead, MD  Patient Care Team: Barbie Banner, MD as PCP - General [facility] Medicine)  Extended Emergency Contact Information Primary Emergency Contact: [facility] Address: [address]          [place], [place] [address] [name] of [place] Home Phone: [phone] Mobile Phone: [phone] Relation: Daughter Secondary Emergency Contact: [name],[name] Address: [address] [place], [place] [address] [place] Home Phone: [phone] Relation: Spouse    Allergies: Codeine; Demerol [meperidine]; Benzocaine; Darvon [propoxyphene]; Penicillins; Phenobarbital; and Sulfa antibiotics  Chief Complaint  Patient presents with  . Acute Visit    Acute    HPI: Patient is 83 y.o. female who is being seen acutely for L side CP,  Sharp in nature, onset after lunch, similar to her episode on Monday- same time, after lunchand it went away spontaneously. ; pt's CP resolved with 2 SL NTG , HOWEVER, pt was noted to have an irregular rhythm, rate 135, O2 sat 97%.   Past Medical History:  Diagnosis Date  . CHF (congestive heart failure) (HCC)   . CKD (chronic kidney disease) stage 3, GFR 30-59 ml/min 04/17/2013   improved off Hyzaar  . Critical lower limb ischemia 04/17/2013  . Dementia    Thought to be vascular  . Diverticulosis of colon    With recurrent bouts of diverticulitis  . Glaucoma   . Hearing loss of aging   . Hypertension   . Irritable bowel syndrome (IBS)   . Osteoporosis   . PAD (peripheral artery disease), with Lt SFA stenosis 04/17/2013   LSFA PTA  . PONV (postoperative nausea and vomiting)     Past Surgical History:  Procedure Laterality Date  . ABDOMINAL HYSTERECTOMY    . ANGIOPLASTY / STENTING FEMORAL  04/18/13   LSFA  . BREAST SURGERY  1968   removal of non cancerous lump  . LOWER EXTREMITY ANGIOGRAM Bilateral 04/12/2013   Procedure: LOWER EXTREMITY ANGIOGRAM;  Surgeon: Runell Gess, MD;  Location: [facility] CATH LAB;  Service: Cardiovascular;  Laterality: Bilateral;  Limited bilateral  . LOWER EXTREMITY ANGIOGRAM Bilateral 04/18/2013   Procedure: LOWER EXTREMITY ANGIOGRAM;  Surgeon: Runell Gess, MD;  Location: [facility] CATH LAB;  Service: Cardiovascular;  Laterality: Bilateral;    Allergies as of 02/12/2017      Reactions   Codeine Itching, Other (See Comments)   hyper   Demerol [meperidine] Nausea Only   Benzocaine Rash   Darvon [propoxyphene] Rash   Penicillins Itching, Rash   Phenobarbital Itching, Rash   Sulfa Antibiotics Itching, Rash      Medication List       Accurate as of 02/12/17  2:26 PM. Always use your most recent med list.          Acidophilus Lactobacillus Caps Take 1 capsule by mouth 3 (three) times daily with meals.   albuterol 108 (90 Base) MCG/ACT inhaler Commonly known as:  PROVENTIL HFA;VENTOLIN HFA Inhale 2 puffs into the lungs 3 (three) times daily as needed for wheezing.   aspirin EC 81 MG tablet Take 81 mg by mouth daily before breakfast.   clopidogrel 75 MG tablet Commonly known as:  PLAVIX Take 75 mg by mouth daily before breakfast.   cyanocobalamin 1000 MCG tablet Take 1,000 mcg by mouth every morning.   diltiazem 240 MG 24 hr  capsule Commonly known as:  TIAZAC Take 240 mg by mouth daily.   divalproex 125 MG capsule Commonly known as:  DEPAKOTE SPRINKLE Take by mouth 2 (two) times daily.   donepezil 10 MG tablet Commonly known as:  ARICEPT Take 10 mg by mouth at bedtime.   EQL VITAMIN D3 2000 units Caps Generic drug:  Cholecalciferol Take 1 capsule by mouth daily before breakfast.   ipratropium 0.03 % nasal spray Commonly known as:  ATROVENT Place 2 sprays into both nostrils 2 (two) times daily.   loratadine 10 MG tablet Commonly known as:  CLARITIN Take 10 mg by mouth daily as needed for allergies.   megestrol 400 MG/10ML suspension Commonly  known as:  MEGACE Take 200 mg by mouth daily. 5 ml = 200 mg   Melatonin 5 MG Tabs Take 5 mg by mouth at bedtime.   Metoprolol Tartrate 75 MG Tabs Take 75 mg by mouth 2 (two) times daily.   nystatin powder Commonly known as:  MYCOSTATIN/NYSTOP Apply 1 application topically 2 times daily for 14 days   senna-docusate 8.6-50 MG tablet Commonly known as:  Senokot-S Take 1 tablet by mouth at bedtime.   triamterene-hydrochlorothiazide 37.5-25 MG tablet Commonly known as:  MAXZIDE-25 Take 1 tablet by mouth every morning.       No orders of the defined types were placed in this encounter.   Immunization History  Administered Date(s) Administered  . Influenza Whole 07/07/2015  . Influenza, High Dose Seasonal PF 07/10/2016  . Influenza-Unspecified 09/11/2014  . PPD Test 08/11/2016  . Pneumococcal Conjugate-13 12/05/2013  . Pneumococcal Polysaccharide-23 07/10/2016  . Tdap 09/05/2013    Social History  Substance Use Topics  . Smoking status: Never Smoker  . Smokeless tobacco: Never Used  . Alcohol use No    Review of Systems  DATA OBTAINED: from patient, nurse, medical record, family member GENERAL:  no fevers, fatigue, appetite changes SKIN: No itching, rash HEENT: No complaint RESPIRATORY: No cough, wheezing, SOB CARDIAC:  chest pain resolved,no  palpitations, lower extremity edema  GI: No abdominal pain, No N/V/D or constipation, No heartburn or reflux ; some burping GU: No dysuria, frequency or urgency, or incontinence  MUSCULOSKELETAL: No unrelieved bone/joint pain NEUROLOGIC: No headache, dizziness  PSYCHIATRIC: No overt anxiety or sadness  Vitals:   02/12/17 1346  BP: 122/76  Pulse: 86  Resp: 18  Temp: 97.8 F (36.6 C)   Body mass index is 26.57 kg/m. Physical Exam  GENERAL APPEARANCE: Alert, conversant, No acute distress  SKIN: No diaphoresis rash HEENT: Unremarkable RESPIRATORY: Breathing is even, unlabored. Lung sounds are clear     CARDIOVASCULAR: Heart irrer irreg rate 137, no murmurs, rubs or gallops. No peripheral edema  GASTROINTESTINAL: Abdomen is soft, non-tender, not distended w/ normal bowel sounds.  GENITOURINARY: Bladder non tender, not distended  MUSCULOSKELETAL: No abnormal joints or musculature NEUROLOGIC: Cranial nerves 2-12 grossly intact. Moves all extremities PSYCHIATRIC: Mood and affect appropriate to situation, no behavioral issues  Patient Active Problem List   Diagnosis Date Noted  . Acute cholecystitis 01/27/2017  . Candida UTI 01/27/2017  . Acute respiratory failure with hypoxia (HCC) 01/27/2017  . Atrial fibrillation with RVR (HCC) 01/27/2017  . PVD (peripheral vascular disease) (HCC) 01/27/2017  . CHF (congestive heart failure) (HCC)   . Critical lower limb ischemia- gangrene Lt 5th toe- LSFA DBA 04/18/13 04/17/2013  . PAD (peripheral artery disease), with Lt SFA stenosis 04/17/2013  . CKD stage 3, SCr normalized off Hyzaar 04/17/2013  .  Ischemic toe- Lt 4 forth toe 04/04/2013  . Hypertensive heart disease with heart failure (HCC) 04/04/2013  . Dementia with behavioral problem 04/04/2013    CMP     Component Value Date/Time   NA 145 01/24/2017   K 3.5 01/24/2017   CL 103 04/19/2013 0535   CO2 22 04/19/2013 0535   GLUCOSE 105 (H) 04/19/2013 0535   BUN 17 01/24/2017   CREATININE 1.0 01/24/2017   CREATININE 1.02 04/19/2013 0535   CALCIUM 8.9 04/19/2013 0535   PROT 7.0 04/18/2013 0624   ALBUMIN 3.1 (L) 04/18/2013 0624   AST 23 01/24/2017   ALT 13 01/24/2017   ALKPHOS 59 01/24/2017   BILITOT 0.4 04/18/2013 0624   GFRNONAA 48 (L) 04/19/2013 0535   GFRAA 55 (L) 04/19/2013 0535    Recent Labs  01/20/17 01/21/17 01/24/17  NA 145 145 145  K 3.9 3.5 3.5  BUN 15 17 17   CREATININE 0.9 1.0 1.0    Recent Labs  01/20/17 01/21/17 01/24/17  AST 17 23 23   ALT 11 13 13   ALKPHOS 58 59 59    Recent Labs  01/22/17 01/23/17 01/24/17  WBC 9.8 9.5 7.2  HGB 8.5* 9.0* 10.5*  HCT 27*  29* 34*  PLT 338 341 296    Recent Labs  07/10/16  CHOL 177  LDLCALC 93  TRIG 172*   No results found for: MICROALBUR No results found for: TSH No results found for: HGBA1C Lab Results  Component Value Date   CHOL 177 07/10/2016   HDL 50 07/10/2016   LDLCALC 93 07/10/2016   TRIG 172 (A) 07/10/2016    Significant Diagnostic Results in last 30 days:  No results found.  Assessment and Plan  CP, RESOLVED/AFIB WITH RVR - pt had a similar on Monday at lunch that resolves spontaneously; this is smiliar; ST says pt has some sort of esophageal problem/stricture and gets CP with eating with her; it is possible the CP is GI, unfortunately the probable afib with RVR in setting of CP is not; will be sending pt to the hospital   Time spent . 35 min [name]. Julie Hollingshead, MD

## 2017-02-13 LAB — CBC AND DIFFERENTIAL
HCT: 29 % — AB (ref 36–46)
Hemoglobin: 9.4 g/dL — AB (ref 12.0–16.0)
PLATELETS: 239 10*3/uL (ref 150–399)
WBC: 7.8 10*3/mL

## 2017-02-16 LAB — BASIC METABOLIC PANEL
BUN: 38 mg/dL — AB (ref 4–21)
Creatinine: 1.8 mg/dL — AB (ref 0.5–1.1)
GLUCOSE: 100 mg/dL
POTASSIUM: 4.1 mmol/L (ref 3.4–5.3)
Sodium: 142 mmol/L (ref 137–147)

## 2017-02-17 ENCOUNTER — Encounter: Payer: Self-pay | Admitting: Internal Medicine

## 2017-02-17 ENCOUNTER — Non-Acute Institutional Stay (SKILLED_NURSING_FACILITY): Payer: Medicare Other | Admitting: Internal Medicine

## 2017-02-17 DIAGNOSIS — E559 Vitamin D deficiency, unspecified: Secondary | ICD-10-CM | POA: Diagnosis not present

## 2017-02-17 DIAGNOSIS — F02818 Alzheimer's disease with late onset: Secondary | ICD-10-CM

## 2017-02-17 DIAGNOSIS — I110 Hypertensive heart disease with heart failure: Secondary | ICD-10-CM | POA: Diagnosis not present

## 2017-02-17 DIAGNOSIS — I5023 Acute on chronic systolic (congestive) heart failure: Secondary | ICD-10-CM

## 2017-02-17 DIAGNOSIS — I739 Peripheral vascular disease, unspecified: Secondary | ICD-10-CM

## 2017-02-17 DIAGNOSIS — I4891 Unspecified atrial fibrillation: Secondary | ICD-10-CM

## 2017-02-17 DIAGNOSIS — Z1621 Resistance to vancomycin: Secondary | ICD-10-CM

## 2017-02-17 DIAGNOSIS — G301 Alzheimer's disease with late onset: Secondary | ICD-10-CM

## 2017-02-17 DIAGNOSIS — A491 Streptococcal infection, unspecified site: Secondary | ICD-10-CM

## 2017-02-17 DIAGNOSIS — J9601 Acute respiratory failure with hypoxia: Secondary | ICD-10-CM

## 2017-02-17 DIAGNOSIS — F0281 Dementia in other diseases classified elsewhere with behavioral disturbance: Secondary | ICD-10-CM

## 2017-02-17 DIAGNOSIS — I208 Other forms of angina pectoris: Secondary | ICD-10-CM

## 2017-02-17 DIAGNOSIS — I2089 Other forms of angina pectoris: Secondary | ICD-10-CM

## 2017-02-17 NOTE — Progress Notes (Signed)
: Provider:  Randon Goldsmith. [name], MD Location:  [facility] Room Number: [phone] Place of Service:  SNF ([phone])  PCP: Barbie Banner, MD Patient Care Team: Barbie Banner, MD as PCP - General [facility] Medicine)  Extended Emergency Contact Information Primary Emergency Contact: [facility] Address: [address]          [place], [place] [address] [name] of [place] Home Phone: [phone] Mobile Phone: [phone] Relation: Daughter Secondary Emergency Contact: Schuessler,[name] Address: [address] [place], [place] [address] [place] Home Phone: [phone] Relation: Spouse     Allergies: Codeine; Demerol [meperidine]; Benzocaine; Darvon [propoxyphene]; Penicillins; Phenobarbital; and Sulfa antibiotics  Chief Complaint  Patient presents with  . New Admit To SNF    Admit to Facility    HPI: Patient is 83 y.o. female with Hypertension, chronic diastolic congestive heart failure, atrial fibrillation, and dementia who was brought to [facility] regional ED because of chest pain. In the ED she was found to have atrial fibrillation with R the R and a UTI. Patient was admitted to [facility] from May 10-14 for evaluation and treatment for above problems. Patient's heart rate improved with IV Cardizem and she was then trans-referred to oral metoprolol and Cardizem for rate control. Patient was treated empirically with IV Rocephin for her UTI. However urine culture revealed enterococcus which was the RE and only susceptible total anemia easily added so her antibiotic was changed to linezolid. Patient's hospital course was further complicated by pulmonary edema which required IV Lasix for improvement in her oxygen saturation. Patient is on 2 L of O2 for chronic hypoxic respiratory failure. Patient had wanted family to go to the hospice house however she was not deemed appropriate and she is admitted to SNF with generalized  weakness for OT/PT. While at skilled nursing facility patient will be treated for peripheral vascular disease treated with aspirin and Plavix, hypertension treated with diltiazem and metoprolol, and dementia treated with Aricept.  Past Medical History:  Diagnosis Date  . CHF (congestive heart failure) (HCC)   . CKD (chronic kidney disease) stage 3, GFR 30-59 ml/min 04/17/2013   improved off Hyzaar  . Critical lower limb ischemia 04/17/2013  . Dementia    Thought to be vascular  . Diverticulosis of colon    With recurrent bouts of diverticulitis  . Glaucoma   . Hearing loss of aging   . Hypertension   . Irritable bowel syndrome (IBS)   . Osteoporosis   . PAD (peripheral artery disease), with Lt SFA stenosis 04/17/2013   LSFA PTA  . PONV (postoperative nausea and vomiting)     Past Surgical History:  Procedure Laterality Date  . ABDOMINAL HYSTERECTOMY    . ANGIOPLASTY / STENTING FEMORAL  04/18/13   LSFA  . BREAST SURGERY  1968   removal of non cancerous lump  . LOWER EXTREMITY ANGIOGRAM Bilateral 04/12/2013   Procedure: LOWER EXTREMITY ANGIOGRAM;  Surgeon: Runell Gess, MD;  Location: [facility] CATH LAB;  Service: Cardiovascular;  Laterality: Bilateral;  Limited bilateral  . LOWER EXTREMITY ANGIOGRAM Bilateral 04/18/2013   Procedure: LOWER EXTREMITY ANGIOGRAM;  Surgeon: Runell Gess, MD;  Location: [facility] CATH LAB;  Service: Cardiovascular;  Laterality: Bilateral;    Allergies as of 02/17/2017      Reactions   Codeine Itching, Other (See Comments)   hyper   Demerol [meperidine] Nausea Only  Benzocaine Rash   Darvon [propoxyphene] Rash   Penicillins Itching, Rash   Phenobarbital Itching, Rash   Sulfa Antibiotics Itching, Rash      Medication List       Accurate as of 02/17/17  9:03 AM. Always use your most recent med list.          Acidophilus Lactobacillus Caps Take 1 capsule by mouth 2 (two) times daily. For 15 days   albuterol 108 (90 Base) MCG/ACT inhaler Commonly  known as:  PROVENTIL HFA;VENTOLIN HFA Inhale 2 puffs into the lungs 3 (three) times daily as needed for wheezing.   aspirin EC 81 MG tablet Take 81 mg by mouth daily before breakfast.   clopidogrel 75 MG tablet Commonly known as:  PLAVIX Take 75 mg by mouth daily before breakfast.   cyanocobalamin 1000 MCG tablet Take 1,000 mcg by mouth every morning.   diltiazem 240 MG 24 hr capsule Commonly known as:  TIAZAC Take 240 mg by mouth daily.   divalproex 125 MG capsule Commonly known as:  DEPAKOTE SPRINKLE Take by mouth 2 (two) times daily.   donepezil 10 MG tablet Commonly known as:  ARICEPT Take 10 mg by mouth at bedtime.   EQL VITAMIN D3 2000 units Caps Generic drug:  Cholecalciferol Take 1 capsule by mouth daily before breakfast.   furosemide 20 MG tablet Commonly known as:  LASIX Take 20 mg by mouth daily.   ipratropium 0.03 % nasal spray Commonly known as:  ATROVENT Place 2 sprays into both nostrils 2 (two) times daily.   linezolid 600 MG tablet Commonly known as:  ZYVOX Take 600 mg by mouth every 12 (twelve) hours. For 13 days   loratadine 10 MG tablet Commonly known as:  CLARITIN Take 10 mg by mouth daily as needed for allergies.   megestrol 400 MG/10ML suspension Commonly known as:  MEGACE Take 200 mg by mouth daily. 5 ml = 200 mg   Melatonin 5 MG Tabs Take 5 mg by mouth at bedtime.   Metoprolol Tartrate 75 MG Tabs Take 75 mg by mouth 2 (two) times daily.   senna-docusate 8.6-50 MG tablet Commonly known as:  Senokot-S Take 1 tablet by mouth at bedtime.       Meds ordered this encounter  Medications  . furosemide (LASIX) 20 MG tablet    Sig: Take 20 mg by mouth daily.  Marland Kitchen. linezolid (ZYVOX) 600 MG tablet    Sig: Take 600 mg by mouth every 12 (twelve) hours. For 13 days    Immunization History  Administered Date(s) Administered  . Influenza Whole 07/07/2015  . Influenza, High Dose Seasonal PF 07/10/2016  . Influenza-Unspecified 09/11/2014  .  PPD Test 08/11/2016  . Pneumococcal Conjugate-13 12/05/2013  . Pneumococcal Polysaccharide-23 07/10/2016  . Tdap 09/05/2013    Social History  Substance Use Topics  . Smoking status: Never Smoker  . Smokeless tobacco: Never Used  . Alcohol use No    Family history is   Family History  Problem Relation Age of Onset  . Stomach cancer Mother   . Hypertension Mother   . Heart attack Father   . Hypertension Father   . Allergies Neg Hx   . Anesthesia problems Neg Hx   . Cancer Neg Hx   . Clotting disorder Neg Hx   . Diabetes Neg Hx   . Asthma Neg Hx   . Hearing loss Neg Hx   . Heart disease Neg Hx   . Neurologic Disorder Neg Hx   .  Thyroid disease Neg Hx       Review of Systems  DATA OBTAINED: from patient, nurse GENERAL:  no fevers, fatigue, appetite changes SKIN: No itching, or rash EYES: No eye pain, redness, discharge EARS: No earache, tinnitus, change in hearing NOSE: No congestion, drainage or bleeding  MOUTH/THROAT: No mouth or tooth pain, No sore throat RESPIRATORY: No cough, wheezing, SOB CARDIAC: No chest pain, palpitations, lower extremity edema  GI: No abdominal pain, No N/V/D or constipation, No heartburn or reflux  GU: No dysuria, frequency or urgency, or incontinence  MUSCULOSKELETAL: No unrelieved bone/joint pain NEUROLOGIC: No headache, dizziness or focal weakness PSYCHIATRIC: No c/o anxiety or sadness   Vitals:   02/17/17 0840  BP: 103/60  Pulse: 79  Resp: 17  Temp: 97.4 F (36.3 C)    SpO2 Readings from Last 1 Encounters:  02/17/17 99%   Body mass index is 22.96 kg/m.     Physical Exam  GENERAL APPEARANCE: Alert, conversant,  No acute distress.  SKIN: No diaphoresis rash HEAD: Normocephalic, atraumatic  EYES: Conjunctiva/lids clear. Pupils round, reactive. EOMs intact.  EARS: External exam WNL, canals clear. Hearing grossly normal.  NOSE: No deformity or discharge.  MOUTH/THROAT: Lips w/o lesions  RESPIRATORY: Breathing is  even, unlabored. Lung sounds are clear   CARDIOVASCULAR: Heart RRR no murmurs, rubs or gallops. No peripheral edema.   GASTROINTESTINAL: Abdomen is soft, non-tender, not distended w/ normal bowel sounds. GENITOURINARY: Bladder non tender, not distended  MUSCULOSKELETAL: No abnormal joints or musculature NEUROLOGIC:  Cranial nerves 2-12 grossly intact. Moves all extremities  PSYCHIATRIC: Mood and affect appropriate  With dementia, no behavioral issues  Patient Active Problem List   Diagnosis Date Noted  . Acute cholecystitis 01/27/2017  . Candida UTI 01/27/2017  . Acute respiratory failure with hypoxia (HCC) 01/27/2017  . Atrial fibrillation with RVR (HCC) 01/27/2017  . PVD (peripheral vascular disease) (HCC) 01/27/2017  . CHF (congestive heart failure) (HCC)   . Critical lower limb ischemia- gangrene Lt 5th toe- LSFA DBA 04/18/13 04/17/2013  . PAD (peripheral artery disease), with Lt SFA stenosis 04/17/2013  . CKD stage 3, SCr normalized off Hyzaar 04/17/2013  . Ischemic toe- Lt 4 forth toe 04/04/2013  . Hypertensive heart disease with heart failure (HCC) 04/04/2013  . Dementia with behavioral problem 04/04/2013      Labs reviewed: Basic Metabolic Panel:    Component Value Date/Time   NA 142 02/16/2017   K 4.1 02/16/2017   CL 103 04/19/2013 0535   CO2 22 04/19/2013 0535   GLUCOSE 105 (H) 04/19/2013 0535   BUN 38 (A) 02/16/2017   CREATININE 1.8 (A) 02/16/2017   CREATININE 1.02 04/19/2013 0535   CALCIUM 8.9 04/19/2013 0535   PROT 7.0 04/18/2013 0624   ALBUMIN 3.1 (L) 04/18/2013 0624   AST 29 02/12/2017   ALT 30 02/12/2017   ALKPHOS 98 02/12/2017   BILITOT 0.4 04/18/2013 0624   GFRNONAA 48 (L) 04/19/2013 0535   GFRAA 55 (L) 04/19/2013 0535     Recent Labs  01/21/17 01/24/17 02/16/17  NA 145 145 142  K 3.5 3.5 4.1  BUN 17 17 38*  CREATININE 1.0 1.0 1.8*   Liver Function Tests:  Recent Labs  01/21/17 01/24/17 02/12/17  AST 23 23 29   ALT 13 13 30   ALKPHOS 59 59  98   No results for input(s): LIPASE, AMYLASE in the last 8760 hours. No results for input(s): AMMONIA in the last 8760 hours. CBC:  Recent Labs  01/23/17 01/24/17  02/13/17  WBC 9.5 7.2 7.8  HGB 9.0* 10.5* 9.4*  HCT 29* 34* 29*  PLT 341 296 239   Lipid  Recent Labs  07/10/16  CHOL 177  HDL 50  LDLCALC 93  TRIG 172*    Cardiac Enzymes: No results for input(s): CKTOTAL, CKMB, CKMBINDEX, TROPONINI in the last 8760 hours. BNP: No results for input(s): BNP in the last 8760 hours. No results found for: MICROALBUR No results found for: HGBA1C No results found for: TSH No results found for: VITAMINB12 No results found for: FOLATE No results found for: IRON, TIBC, FERRITIN  Imaging and Procedures obtained prior to SNF admission: Dg Chest 2 View  Result Date: 04/18/2013 *RADIOLOGY REPORT* Clinical Data: Preoperative chest radiograph. CHEST - 2 VIEW Comparison: Chest radiograph from 10/18/2010 Findings: The lungs are well-aerated.  Mild chronic peribronchial thickening is noted.  There is no evidence of focal opacification, pleural effusion or pneumothorax. The heart is normal in size; the mediastinal contour is within normal limits.  No acute osseous abnormalities are seen.  There is mild chronic deformity involving the proximal right humerus. Relatively diffuse calcification is noted along the distal descending and proximal abdominal aorta. IMPRESSION: Mild chronic peribronchial thickening noted; no acute cardiopulmonary process seen. Original Report Authenticated By: Tonia Ghent, M.D.     Not all labs, radiology exams or other studies done during hospitalization come through on my EPIC note; however they are reviewed by me.    Assessment and Plan  CP/ AF WITH RVR-serial troponins were negative and patient's chest pain improved with nitroglycerin sublingual. Heart rate improved after bolus of IV Cardizem and then she was transitioned to oral metoprolol and Cardizem which  continued her controlled rate. She been evaluated by cardiology in the past 2 had deemed her not a candidate for anticoagulation secondary to advanced age and the fact that she was on dual antiplatelet therapy, ASA and Plavix, peripheral vascular disease. snf - ADMIT FOR ot/p; CONT DILTIAZEM  240 mg daily anf metoprolol 75 mg BID with plavix 75 mg and ASA 81 mg as prophylaxis  VRE ENTEROCOCCUS FAECIUM UTI - patient was started empirically on IV Rocephin but when VRE enterococcus grew out it was susceptible only to linezolid and patient was transferred to [facility]osta is a limited 600 mg every 12 hours orally. SNF - cont zyvox 600 mgq 12 for 13 days  ACUTE ON CHRONIC HYPOXIC RESP FAILURE 2/2 ACUTE ON CHRONIC SYSTOLIC CHF - -. He was seen on patient's chest x-ray and bibasilar rales were heard on exam; IV Lasix was given with improvement in patient's oxygen saturation had dropped into the 80s after treatment it came back up into the low 90s which is her baseline. SNF - cont O2 2L [place]; follow weights; cont metoprolol 75 mg dailya amd lasix 20 mg daily  PVD SNF - no current wounds; cont plavix 75 mg daily and ASA 81 mg daily  HTN SNF - controlled ; cont Diltiazem 120 mg daily and metoprolol 75 mg daily  DEMENTIA WITH BEHAVOIRS SNF - cont aricept 10 mg daily with depalote 125 mg BID  VITAMIN D DEF SNF - cont  Replacement at 2000 u daily   Time spent> 45 min;> 50% of time with patient was spent reviewing records, labs, tests and studies, counseling and developing plan of care   Thurston Hole D. [name], MD

## 2017-02-19 ENCOUNTER — Encounter: Payer: Self-pay | Admitting: Internal Medicine

## 2017-02-19 ENCOUNTER — Non-Acute Institutional Stay (SKILLED_NURSING_FACILITY): Payer: Medicare Other | Admitting: Internal Medicine

## 2017-02-19 DIAGNOSIS — Z1621 Resistance to vancomycin: Secondary | ICD-10-CM

## 2017-02-19 DIAGNOSIS — F02818 Dementia in other diseases classified elsewhere, unspecified severity, with other behavioral disturbance: Secondary | ICD-10-CM

## 2017-02-19 DIAGNOSIS — J9601 Acute respiratory failure with hypoxia: Secondary | ICD-10-CM

## 2017-02-19 DIAGNOSIS — I5023 Acute on chronic systolic (congestive) heart failure: Secondary | ICD-10-CM

## 2017-02-19 DIAGNOSIS — I208 Other forms of angina pectoris: Secondary | ICD-10-CM

## 2017-02-19 DIAGNOSIS — I4891 Unspecified atrial fibrillation: Secondary | ICD-10-CM

## 2017-02-19 DIAGNOSIS — G301 Alzheimer's disease with late onset: Secondary | ICD-10-CM

## 2017-02-19 DIAGNOSIS — A491 Streptococcal infection, unspecified site: Secondary | ICD-10-CM | POA: Insufficient documentation

## 2017-02-19 DIAGNOSIS — R079 Chest pain, unspecified: Secondary | ICD-10-CM | POA: Insufficient documentation

## 2017-02-19 DIAGNOSIS — I739 Peripheral vascular disease, unspecified: Secondary | ICD-10-CM

## 2017-02-19 DIAGNOSIS — F0281 Dementia in other diseases classified elsewhere with behavioral disturbance: Secondary | ICD-10-CM

## 2017-02-19 DIAGNOSIS — E559 Vitamin D deficiency, unspecified: Secondary | ICD-10-CM

## 2017-02-19 DIAGNOSIS — I110 Hypertensive heart disease with heart failure: Secondary | ICD-10-CM

## 2017-02-19 NOTE — Progress Notes (Signed)
Location:  [facility] Room Number: [phone] Place of Service:  SNF [phone]) [name]. Julie Hollingshead, MD  PCP: Barbie Banner, MD Patient Care Team: Barbie Banner, MD as PCP - General [facility] Medicine)  Extended Emergency Contact Information Primary Emergency Contact: [facility] Address: [address]          [place], [place] [address] [name] Home Phone: [phone] Mobile Phone: [phone] Relation: Daughter Secondary Emergency Contact: Guess,[name] Address: [address] [place], [place] [address] [place] Home Phone: [phone] Relation: Spouse  Allergies  Allergen Reactions  . Codeine Itching and Other (See Comments)    hyper  . Demerol [Meperidine] Nausea Only  . Benzocaine Rash  . Darvon [Propoxyphene] Rash  . Penicillins Itching and Rash  . Phenobarbital Itching and Rash  . Sulfa Antibiotics Itching and Rash    Chief Complaint  Patient presents with  . Discharge Note    Discharged from SNF    HPI:  83 y.o. female  with Hypertension, chronic diastolic congestive heart failure, atrial fibrillation, and dementia who was brought to [facility] regional ED because of chest pain. In the ED she was found to have atrial fibrillation with R the R and a UTI. Patient was admitted to [facility] from May 10-14 for evaluation and treatment for above problems. Patient's heart rate improved with IV Cardizem and she was then trans-referred to oral metoprolol and Cardizem for rate control. Patient was treated empirically with IV Rocephin for her UTI. However urine culture revealed enterococcus which was the RE and only susceptible total anemia easily added so her antibiotic was changed to linezolid. Patient's hospital course was further complicated by pulmonary edema which required IV Lasix for improvement in her oxygen saturation. Patient is on 2 L of O2 for chronic hypoxic respiratory failure. Patient  had wanted family to go to the hospice house however she was not deemed appropriate and she is admitted to SNF with generalized weakness for OT/PT. Pt is now ready to be d/c [facility].    Past Medical History:  Diagnosis Date  . CHF (congestive heart failure) (HCC)   . CKD (chronic kidney disease) stage 3, GFR 30-59 ml/min 04/17/2013   improved off Hyzaar  . Critical lower limb ischemia 04/17/2013  . Dementia    Thought to be vascular  . Diverticulosis of colon    With recurrent bouts of diverticulitis  . Glaucoma   . Hearing loss of aging   . Hypertension   . Irritable bowel syndrome (IBS)   . Osteoporosis   . PAD (peripheral artery disease), with Lt SFA stenosis 04/17/2013   LSFA PTA  . PONV (postoperative nausea and vomiting)     Past Surgical History:  Procedure Laterality Date  . ABDOMINAL HYSTERECTOMY    . ANGIOPLASTY / STENTING FEMORAL  04/18/13   LSFA  . BREAST SURGERY  1968   removal of non cancerous lump  . LOWER EXTREMITY ANGIOGRAM Bilateral 04/12/2013   Procedure: LOWER EXTREMITY ANGIOGRAM;  Surgeon: Runell Gess, MD;  Location: [facility] CATH LAB;  Service: Cardiovascular;  Laterality: Bilateral;  Limited bilateral  . LOWER EXTREMITY ANGIOGRAM Bilateral 04/18/2013   Procedure: LOWER EXTREMITY ANGIOGRAM;  Surgeon: Runell Gess, MD;  Location: [facility] CATH LAB;  Service: Cardiovascular;  Laterality: Bilateral;     reports that she has never smoked.  She has never used smokeless tobacco. She reports that she does not drink alcohol or use drugs. Social History   Social History  . Marital status: Married    Spouse name: N/A  . Number of children: 5  . Years of education: N/A   Occupational History  . Retired Artist - [employer]   Social History Main Topics  . Smoking status: Never Smoker  . Smokeless tobacco: Never Used  . Alcohol use No  . Drug use: No  . Sexual activity: Not on file   Other Topics Concern  .  Not on file   Social History Narrative   Married mother of 5. Lives with her husband. Walks occasionally with a cane. She previously worked as a Runner, broadcasting/film/video at [employer]. She is limited as far as her activity based on her arthritis.    Pertinent  Health Maintenance Due  Topic Date Due  . DEXA SCAN  11/15/1989  . INFLUENZA VACCINE  05/06/2017  . PNA vac Low Risk Adult  Completed    Medications: Allergies as of 02/19/2017      Reactions   Codeine Itching, Other (See Comments)   hyper   Demerol [meperidine] Nausea Only   Benzocaine Rash   Darvon [propoxyphene] Rash   Penicillins Itching, Rash   Phenobarbital Itching, Rash   Sulfa Antibiotics Itching, Rash      Medication List       Accurate as of 02/19/17  2:44 PM. Always use your most recent med list.          Acidophilus Lactobacillus Caps Take 1 capsule by mouth 2 (two) times daily. For 15 days   albuterol 108 (90 Base) MCG/ACT inhaler Commonly known as:  PROVENTIL HFA;VENTOLIN HFA Inhale 2 puffs into the lungs 3 (three) times daily as needed for wheezing.   aspirin EC 81 MG tablet Take 81 mg by mouth daily before breakfast.   clopidogrel 75 MG tablet Commonly known as:  PLAVIX Take 75 mg by mouth daily before breakfast.   cyanocobalamin 1000 MCG tablet Take 1,000 mcg by mouth every morning.   diltiazem 240 MG 24 hr capsule Commonly known as:  TIAZAC Take 240 mg by mouth daily.   divalproex 125 MG capsule Commonly known as:  DEPAKOTE SPRINKLE Take by mouth 2 (two) times daily.   donepezil 10 MG tablet Commonly known as:  ARICEPT Take 10 mg by mouth at bedtime.   EQL VITAMIN D3 2000 units Caps Generic drug:  Cholecalciferol Take 1 capsule by mouth daily before breakfast.   furosemide 20 MG tablet Commonly known as:  LASIX Take 20 mg by mouth daily.   ipratropium 0.03 % nasal spray Commonly known as:  ATROVENT Place 2 sprays into both nostrils 2 (two) times daily.   linezolid  600 MG tablet Commonly known as:  ZYVOX Take 600 mg by mouth every 12 (twelve) hours. For 13 days   loratadine 10 MG tablet Commonly known as:  CLARITIN Take 10 mg by mouth daily as needed for allergies.   megestrol 400 MG/10ML suspension Commonly known as:  MEGACE Take 200 mg by mouth daily. 5 ml = 200 mg   Melatonin 5 MG Tabs Take 5 mg by mouth at bedtime.   Metoprolol Tartrate 75 MG Tabs Take 75 mg by mouth 2 (two) times daily.   senna-docusate 8.6-50 MG tablet Commonly known as:  Senokot-S Take 1 tablet by mouth at bedtime.  Vitals:   02/19/17 1300  BP: 100/82  Pulse: 67  Resp: 18  Temp: 97.2 F (36.2 C)  SpO2: 94%  Weight: 129 lb (58.5 kg)  Height: 5\' 3"  (1.6 m)   Body mass index is 22.85 kg/m.  Physical Exam  GENERAL APPEARANCE: Alert, conversant. No acute distress.  HEENT: Unremarkable. RESPIRATORY: Breathing is even, unlabored. Lung sounds are clear   CARDIOVASCULAR: Heart RRR no murmurs, rubs or gallops. No peripheral edema.  GASTROINTESTINAL: Abdomen is soft, non-tender, not distended w/ normal bowel sounds.  NEUROLOGIC: Cranial nerves 2-12 grossly intact. Moves all extremities   Labs reviewed: Basic Metabolic Panel:  Recent Labs  16/07/9603/18/18 01/24/17 02/16/17  NA 145 145 142  K 3.5 3.5 4.1  BUN 17 17 38*  CREATININE 1.0 1.0 1.8*   No results found for: [facility] Liver Function Tests:  Recent Labs  01/21/17 01/24/17 02/12/17  AST 23 23 29   ALT 13 13 30   ALKPHOS 59 59 98   No results for input(s): LIPASE, AMYLASE in the last 8760 hours. No results for input(s): AMMONIA in the last 8760 hours. CBC:  Recent Labs  01/23/17 01/24/17 02/13/17  WBC 9.5 7.2 7.8  HGB 9.0* 10.5* 9.4*  HCT 29* 34* 29*  PLT 341 296 239   Lipid  Recent Labs  07/10/16  CHOL 177  HDL 50  LDLCALC 93  TRIG 172*   Cardiac Enzymes: No results for input(s): CKTOTAL, CKMB, CKMBINDEX, TROPONINI in the last 8760 hours. BNP: No results for input(s):  BNP in the last 8760 hours. CBG: No results for input(s): GLUCAP in the last 8760 hours.  Procedures and Imaging Studies During Stay: No results found.  Assessment/Plan:   Stable angina pectoris (HCC)  Atrial fibrillation with RVR (HCC)  VRE infection (vancomycin resistant Enterococcus)  Acute respiratory failure with hypoxia (HCC)  Acute on chronic systolic CHF (congestive heart failure) (HCC)  Hypertensive heart disease with heart failure (HCC)  Acute on chronic systolic congestive heart failure (HCC)  PVD (peripheral vascular disease) (HCC)  Late onset Alzheimer's disease with behavioral disturbance  Vitamin D deficiency   [facility]/OT/PT/Nursing  Patient is being discharged with the following durable medical equipment:  none  Patient has been advised to f/u with their PCP in 1-2 weeks to bring them up to date on their rehab stay.  Social services at facility was responsible for arranging this appointment.  Pt was provided with a 30 day supply of prescriptions for medications and refills must be obtained from their PCP.  For controlled substances, a more limited supply may be provided adequate until PCP appointment only.   Medications have been reconciled.  Time spent > 30 min;> 50% of time with patient was spent reviewing records, labs, tests and studies, counseling and developing plan of care  Julie Goldsmithnne D. Julie HollingsheadAlexander, MD

## 2017-03-02 ENCOUNTER — Encounter: Payer: Self-pay | Admitting: Internal Medicine

## 2017-03-02 NOTE — Progress Notes (Signed)
[facility]   Place of Service:   SNF  Margit HanksAnne D Henslee Lottman MD  Patient Care Team: Barbie BannerWilson, Fred H, MD as PCP - General [facility](Family [facility])  Extended Emergency Contact Information Primary Emergency Contact: [facility] Address: [address]          [place], [place] [address] [place] Home Phone: [phone] Mobile Phone: [phone] Relation: Daughter Secondary Emergency Contact: Stmarie,[name] Address: [address]          [place], [place] [address] [place] Home Phone: [phone] Relation: Spouse    Allergies: Codeine; Demerol [meperidine]; Benzocaine; Darvon [propoxyphene]; Penicillins; Phenobarbital; and Sulfa antibiotics  No chief complaint on file.   HPI: Patient is 83 y.o. female who is being seen acutely for RUQ/chest pain. Admits onset after eating, better after burping. O2 sat 2L 92%. Pain is not well described but seems sharp. No SOB, no radiation, no nausea.  Past Medical History:  Diagnosis Date  . CHF (congestive heart failure) (HCC)   . CKD (chronic kidney disease) stage 3, GFR 30-59 ml/min 04/17/2013   improved off Hyzaar  . Critical lower limb ischemia 04/17/2013  . Dementia    Thought to be vascular  . Diverticulosis of colon    With recurrent bouts of diverticulitis  . Glaucoma   . Hearing loss of aging   . Hypertension   . Irritable bowel syndrome (IBS)   . Osteoporosis   . PAD (peripheral artery disease), with Lt SFA stenosis 04/17/2013   LSFA PTA  . PONV (postoperative nausea and vomiting)     Past Surgical History:  Procedure Laterality Date  . ABDOMINAL HYSTERECTOMY    . ANGIOPLASTY / STENTING FEMORAL  04/18/13   LSFA  . BREAST SURGERY  1968   removal of non cancerous lump  . LOWER EXTREMITY ANGIOGRAM Bilateral 04/12/2013   Procedure: LOWER EXTREMITY ANGIOGRAM;  Surgeon: Runell GessJonathan J Berry, MD;  [facility];  Service: Cardiovascular;  Laterality: Bilateral;  Limited bilateral  . LOWER  EXTREMITY ANGIOGRAM Bilateral 04/18/2013   Procedure: LOWER EXTREMITY ANGIOGRAM;  Surgeon: Runell GessJonathan J Berry, MD;  [facility];  Service: Cardiovascular;  Laterality: Bilateral;    Allergies as of 02/09/2017      Reactions   Codeine Itching, Other (See Comments)   hyper   Demerol [meperidine] Nausea Only   Benzocaine Rash   Darvon [propoxyphene] Rash   Penicillins Itching, Rash   Phenobarbital Itching, Rash   Sulfa Antibiotics Itching, Rash      Medication List       Accurate as of 02/09/17 11:59 PM. Always use your most recent med list.          Acidophilus Lactobacillus Caps Take 1 capsule by mouth 2 (two) times daily. For 15 days   albuterol 108 (90 Base) MCG/ACT inhaler Commonly known as:  PROVENTIL HFA;VENTOLIN HFA Inhale 2 puffs into the lungs 3 (three) times daily as needed for wheezing.   aspirin EC 81 MG tablet Take 81 mg by mouth daily before breakfast.   clopidogrel 75 MG tablet Commonly known as:  PLAVIX Take 75 mg by mouth daily before breakfast.   cyanocobalamin 1000 MCG tablet Take 1,000 mcg by mouth every morning.   diltiazem 240 MG 24 hr capsule Commonly known as:  TIAZAC Take 240 mg by mouth daily.   divalproex 125 MG capsule Commonly known as:  DEPAKOTE SPRINKLE Take by mouth 2 (two) times daily.   donepezil 10  MG tablet Commonly known as:  ARICEPT Take 10 mg by mouth at bedtime.   EQL VITAMIN D3 2000 units Caps Generic drug:  Cholecalciferol Take 1 capsule by mouth daily before breakfast.   ipratropium 0.03 % nasal spray Commonly known as:  ATROVENT Place 2 sprays into both nostrils 2 (two) times daily.   loratadine 10 MG tablet Commonly known as:  CLARITIN Take 10 mg by mouth daily as needed for allergies.   megestrol 400 MG/10ML suspension Commonly known as:  MEGACE Take 200 mg by mouth daily. 5 ml = 200 mg   Melatonin 5 MG Tabs Take 5 mg by mouth at bedtime.   Metoprolol Tartrate 75 MG Tabs Take 75 mg by mouth 2 (two)  times daily.   senna-docusate 8.6-50 MG tablet Commonly known as:  Senokot-S Take 1 tablet by mouth at bedtime.       No orders of the defined types were placed in this encounter.   Immunization History  Administered Date(s) Administered  . Influenza Whole 07/07/2015  . Influenza, High Dose Seasonal PF 07/10/2016  . Influenza-Unspecified 09/11/2014  . PPD Test 08/11/2016, 02/08/2017  . Pneumococcal Conjugate-13 12/05/2013  . Pneumococcal Polysaccharide-23 07/10/2016  . Tdap 09/05/2013    Social History  Substance Use Topics  . Smoking status: Never Smoker  . Smokeless tobacco: Never Used  . Alcohol use No    Review of Systems  DATA OBTAINED: from patient, nurse- as per HPI GENERAL:  no fevers, fatigue, appetite changes SKIN: No itching, rash HEENT: No complaint RESPIRATORY: No cough, wheezing, SOB CARDIAC: + chest pain,no  palpitations, lower extremity edema  GI: + RUQ abdominal pain, No N/V/D or constipation, No heartburn or reflux  GU: No dysuria, frequency or urgency, or incontinence  MUSCULOSKELETAL: No unrelieved bone/joint pain NEUROLOGIC: No headache, dizziness  PSYCHIATRIC: No overt anxiety or sadness  Vitals:   03/02/17 1504  BP: 122/76  Pulse: 86  Resp: 18  Temp: 97.8 F (36.6 C)   There is no height or weight on file to calculate BMI. Physical Exam  GENERAL APPEARANCE: Alert, conversant, No acute distress  SKIN: No diaphoresis rash HEENT: Unremarkable RESPIRATORY: Breathing is even, unlabored. Lung sounds are clear   CARDIOVASCULAR: Heart RRR no murmurs, rubs or gallops. No peripheral edema  GASTROINTESTINAL: Abdomen is soft, + RUQ and epigastric tender, not distended w/ normal bowel sounds.  GENITOURINARY: Bladder non tender, not distended  MUSCULOSKELETAL: No abnormal joints or musculature NEUROLOGIC: Cranial nerves 2-12 grossly intact. Moves all extremities PSYCHIATRIC: Mood and affect appropriate to situation, no behavioral  issues  Patient Active Problem List   Diagnosis Date Noted  . Chest pain 02/19/2017  . VRE infection (vancomycin resistant Enterococcus) 02/19/2017  . Acute on chronic systolic CHF (congestive heart failure) (HCC) 02/19/2017  . Vitamin D deficiency 02/19/2017  . Acute cholecystitis 01/27/2017  . Candida UTI 01/27/2017  . Acute respiratory failure with hypoxia (HCC) 01/27/2017  . Atrial fibrillation with RVR (HCC) 01/27/2017  . PVD (peripheral vascular disease) (HCC) 01/27/2017  . CHF (congestive heart failure) (HCC)   . Critical lower limb ischemia- gangrene Lt 5th toe- LSFA DBA 04/18/13 04/17/2013  . PAD (peripheral artery disease), with Lt SFA stenosis 04/17/2013  . CKD stage 3, SCr normalized off Hyzaar 04/17/2013  . Ischemic toe- Lt 4 forth toe 04/04/2013  . Hypertensive heart disease with heart failure (HCC) 04/04/2013  . Dementia with behavioral problem 04/04/2013    CMP     Component Value Date/Time   NA  142 02/16/2017   K 4.1 02/16/2017   CL 103 04/19/2013 0535   CO2 22 04/19/2013 0535   GLUCOSE 105 (H) 04/19/2013 0535   BUN 38 (A) 02/16/2017   CREATININE 1.8 (A) 02/16/2017   CREATININE 1.02 04/19/2013 0535   CALCIUM 8.9 04/19/2013 0535   PROT 7.0 04/18/2013 0624   ALBUMIN 3.1 (L) 04/18/2013 0624   AST 29 02/12/2017   ALT 30 02/12/2017   ALKPHOS 98 02/12/2017   BILITOT 0.4 04/18/2013 0624   GFRNONAA 48 (L) 04/19/2013 0535   GFRAA 55 (L) 04/19/2013 0535    Recent Labs  01/21/17 01/24/17 02/16/17  NA 145 145 142  K 3.5 3.5 4.1  BUN 17 17 38*  CREATININE 1.0 1.0 1.8*    Recent Labs  01/21/17 01/24/17 02/12/17  AST 23 23 29   ALT 13 13 30   ALKPHOS 59 59 98    Recent Labs  01/23/17 01/24/17 02/13/17  WBC 9.5 7.2 7.8  HGB 9.0* 10.5* 9.4*  HCT 29* 34* 29*  PLT 341 296 239    Recent Labs  07/10/16  CHOL 177  LDLCALC 93  TRIG 172*   No results found for: MICROALBUR No results found for: TSH No results found for: HGBA1C Lab Results  Component  Value Date   CHOL 177 07/10/2016   HDL 50 07/10/2016   LDLCALC 93 07/10/2016   TRIG 172 (A) 07/10/2016    Significant Diagnostic Results in last 30 days:  No results found.  Assessment and Plan  ATYPICAL CP -  Appears to be GI in origin; resolved now; will cont O2 and monitor her throughout the day    Time spent > 35 min Merrilee Seashore, MD

## 2017-03-15 ENCOUNTER — Encounter: Payer: Self-pay | Admitting: Internal Medicine

## 2017-04-05 DEATH — deceased
